# Patient Record
Sex: Male | Born: 1965 | Race: White | Hispanic: No | Marital: Married | Smoking: Current every day smoker
Health system: Southern US, Community
[De-identification: ages and names within clinical notes are randomized; demographics above are authoritative.]

## PROBLEM LIST (undated history)

## (undated) DIAGNOSIS — I2510 Atherosclerotic heart disease of native coronary artery without angina pectoris: Secondary | ICD-10-CM

## (undated) DIAGNOSIS — Z720 Tobacco use: Secondary | ICD-10-CM

## (undated) DIAGNOSIS — F411 Generalized anxiety disorder: Secondary | ICD-10-CM

## (undated) DIAGNOSIS — F429 Obsessive-compulsive disorder, unspecified: Secondary | ICD-10-CM

## (undated) HISTORY — DX: Obsessive-compulsive disorder, unspecified: F42.9

## (undated) HISTORY — PX: ANKLE RECONSTRUCTION: SHX1151

## (undated) HISTORY — DX: Atherosclerotic heart disease of native coronary artery without angina pectoris: I25.10

## (undated) HISTORY — PX: APPENDECTOMY: SHX54

---

## 2013-06-01 ENCOUNTER — Emergency Department (HOSPITAL_BASED_OUTPATIENT_CLINIC_OR_DEPARTMENT_OTHER): Payer: 59

## 2013-06-01 ENCOUNTER — Encounter (HOSPITAL_BASED_OUTPATIENT_CLINIC_OR_DEPARTMENT_OTHER): Payer: Self-pay | Admitting: *Deleted

## 2013-06-01 ENCOUNTER — Inpatient Hospital Stay (HOSPITAL_BASED_OUTPATIENT_CLINIC_OR_DEPARTMENT_OTHER)
Admission: EM | Admit: 2013-06-01 | Discharge: 2013-06-04 | DRG: 690 | Disposition: A | Discharge status: Home / Self Care | Payer: 59 | Attending: Internal Medicine | Admitting: Internal Medicine

## 2013-06-01 DIAGNOSIS — B9789 Other viral agents as the cause of diseases classified elsewhere: Secondary | ICD-10-CM | POA: Diagnosis present

## 2013-06-01 DIAGNOSIS — N200 Calculus of kidney: Secondary | ICD-10-CM | POA: Diagnosis present

## 2013-06-01 DIAGNOSIS — Z9089 Acquired absence of other organs: Secondary | ICD-10-CM

## 2013-06-01 DIAGNOSIS — E860 Dehydration: Secondary | ICD-10-CM | POA: Diagnosis present

## 2013-06-01 DIAGNOSIS — K7689 Other specified diseases of liver: Secondary | ICD-10-CM | POA: Diagnosis present

## 2013-06-01 DIAGNOSIS — F1210 Cannabis abuse, uncomplicated: Secondary | ICD-10-CM | POA: Diagnosis present

## 2013-06-01 DIAGNOSIS — N390 Urinary tract infection, site not specified: Secondary | ICD-10-CM | POA: Diagnosis present

## 2013-06-01 DIAGNOSIS — D696 Thrombocytopenia, unspecified: Secondary | ICD-10-CM | POA: Diagnosis present

## 2013-06-01 DIAGNOSIS — N509 Disorder of male genital organs, unspecified: Secondary | ICD-10-CM | POA: Diagnosis present

## 2013-06-01 DIAGNOSIS — F17200 Nicotine dependence, unspecified, uncomplicated: Secondary | ICD-10-CM | POA: Diagnosis present

## 2013-06-01 DIAGNOSIS — N179 Acute kidney failure, unspecified: Secondary | ICD-10-CM | POA: Diagnosis present

## 2013-06-01 DIAGNOSIS — R6510 Systemic inflammatory response syndrome (SIRS) of non-infectious origin without acute organ dysfunction: Secondary | ICD-10-CM | POA: Diagnosis present

## 2013-06-01 DIAGNOSIS — R7402 Elevation of levels of lactic acid dehydrogenase (LDH): Secondary | ICD-10-CM | POA: Diagnosis present

## 2013-06-01 DIAGNOSIS — R109 Unspecified abdominal pain: Secondary | ICD-10-CM | POA: Diagnosis present

## 2013-06-01 DIAGNOSIS — R7401 Elevation of levels of liver transaminase levels: Secondary | ICD-10-CM | POA: Diagnosis present

## 2013-06-01 LAB — COMPREHENSIVE METABOLIC PANEL
ALT: 100 U/L — ABNORMAL HIGH (ref 0–53)
AST: 88 U/L — ABNORMAL HIGH (ref 0–37)
Albumin: 4.3 g/dL (ref 3.5–5.2)
CO2: 28 mEq/L (ref 19–32)
Calcium: 9.9 mg/dL (ref 8.4–10.5)
GFR calc non Af Amer: 49 mL/min — ABNORMAL LOW (ref >90)
Sodium: 136 mEq/L (ref 135–145)

## 2013-06-01 LAB — CBC WITH DIFFERENTIAL/PLATELET
Basophils Absolute: 0 10*3/uL (ref 0.0–0.1)
Basophils Relative: 0 % (ref 0–1)
Eosinophils Relative: 0 % (ref 0–5)
Lymphocytes Relative: 8 % — ABNORMAL LOW (ref 12–46)
MCV: 91.1 fL (ref 78.0–100.0)
Neutro Abs: 17.8 10*3/uL — ABNORMAL HIGH (ref 1.7–7.7)
Platelets: 235 10*3/uL (ref 150–400)
RDW: 13.1 % (ref 11.5–15.5)
WBC: 20.5 10*3/uL — ABNORMAL HIGH (ref 4.0–10.5)

## 2013-06-01 LAB — URINE MICROSCOPIC-ADD ON

## 2013-06-01 LAB — URINALYSIS, ROUTINE W REFLEX MICROSCOPIC
Nitrite: NEGATIVE
Specific Gravity, Urine: 1.034 — ABNORMAL HIGH (ref 1.005–1.030)
Urobilinogen, UA: 0.2 mg/dL (ref 0.0–1.0)

## 2013-06-01 MED ORDER — ONDANSETRON HCL 4 MG/2ML IJ SOLN
4.0000 mg | Freq: Once | INTRAMUSCULAR | Status: AC
  Administered 2013-06-01: 4 mg via INTRAVENOUS
  Filled 2013-06-01: qty 2

## 2013-06-01 MED ORDER — HYDROMORPHONE HCL PF 1 MG/ML IJ SOLN
1.0000 mg | Freq: Once | INTRAMUSCULAR | Status: AC
  Administered 2013-06-01: 1 mg via INTRAVENOUS
  Filled 2013-06-01: qty 1

## 2013-06-01 MED ORDER — SODIUM CHLORIDE 0.9 % IV BOLUS (SEPSIS)
1000.0000 mL | Freq: Once | INTRAVENOUS | Status: AC
  Administered 2013-06-01: 1000 mL via INTRAVENOUS

## 2013-06-01 NOTE — ED Provider Notes (Signed)
CSN: [id number]     Arrival date & time 06/01/13  1906 History   This chart was scribed for Rolan Bucco, MD by Karle Plumber, ED Scribe. This patient was seen in room MH04/MH04 and the patient's care was started at 8:45 PM.    Chief Complaint  Patient presents with  . Flank Pain   The history is provided by the patient. No language interpreter was used.   HPI Comments:  Edward Serrano is a 47 y.o. male who presents to the Emergency Department complaining of constant, severe right-sided flank pain onset 1:30 AM today. Wife states that pain started in his right groin and moved up to RLQ and flank. He has had several episodes of associated emesis. He was seen at [facility] this morning for the same symptoms and had a CT scan. He reports he was told it is suspected that he passed a kidney stone, but one was not detected on scan. He was prescribed Valium, Phenergan, Percocet, and Toradol but has not had any relief. His last doses of any medicines were at 4:30 PM today. He denies fever and dysuria. Pt denies h/o kidney stones. He currently smokes everyday and is an occasional alcohol user.  History reviewed. No pertinent past medical history. Past Surgical History  Procedure Laterality Date  . Appendectomy    . Ankle reconstruction     No family history on file. History  Substance Use Topics  . Smoking status: Current Every Day Smoker    Types: Cigarettes  . Smokeless tobacco: Never Used  . Alcohol Use: Yes     Comment: occasional    Review of Systems  Constitutional: Positive for fatigue. Negative for fever, chills and diaphoresis.  HENT: Negative for congestion, rhinorrhea and sneezing.   Eyes: Negative.   Respiratory: Negative for cough, chest tightness and shortness of breath.   Cardiovascular: Negative for chest pain and leg swelling.  Gastrointestinal: Positive for nausea, vomiting and abdominal pain. Negative for diarrhea and blood in stool.  Genitourinary: Positive  for flank pain. Negative for frequency, hematuria, difficulty urinating and testicular pain.  Musculoskeletal: Positive for back pain. Negative for arthralgias.  Skin: Negative for rash.  Neurological: Negative for dizziness, speech difficulty, weakness, numbness and headaches.    Allergies  Review of patient's allergies indicates no known allergies.  Home Medications   Current Outpatient Rx  Name  Route  Sig  Dispense  Refill  . diazepam (VALIUM) 5 MG tablet   Oral   Take 10 mg by mouth every 8 (eight) hours as needed for anxiety.         Marland Kitchen ketorolac (TORADOL) 10 MG tablet   Oral   Take 10 mg by mouth every 6 (six) hours as needed for pain.         Marland Kitchen oxyCODONE-acetaminophen (PERCOCET/ROXICET) 5-325 MG per tablet   Oral   Take 1 tablet by mouth every 4 (four) hours as needed for pain.         . promethazine (PHENERGAN) 25 MG tablet   Oral   Take 25 mg by mouth every 6 (six) hours as needed for nausea.          Triage Vitals: Ht 6' (1.829 m)  Wt 225 lb (102.059 kg)  BMI 30.51 kg/m2 Physical Exam  Constitutional: He is oriented to person, place, and time. He appears well-developed and well-nourished.  HENT:  Head: Normocephalic and atraumatic.  Eyes: Pupils are equal, round, and reactive to light.  Neck: Normal  range of motion. Neck supple.  Cardiovascular: Normal rate, regular rhythm and normal heart sounds.   Pulmonary/Chest: Effort normal and breath sounds normal. No respiratory distress. He has no wheezes. He has no rales. He exhibits no tenderness.  Abdominal: Soft. Bowel sounds are normal. There is tenderness (mild TTP right flank, right lower abd.). There is no rebound and no guarding.  Genitourinary:  Mild tenderness to the right scrotum, but no significant testicular tenderness/no hernias noted  Musculoskeletal: Normal range of motion. He exhibits no edema.  Lymphadenopathy:    He has no cervical adenopathy.  Neurological: He is alert and oriented to  person, place, and time.  Skin: Skin is warm and dry. No rash noted.  Psychiatric: He has a normal mood and affect.    ED Course  Procedures (including critical care time) DIAGNOSTIC STUDIES:  COORDINATION OF CARE: 8:52 PM- Will order chest x-ray and Zofran, Dilaudid and IV fluids. Will review CT results from earlier visit to [facility]. Patient and family verbalize understanding and agree.  Labs Review Results for orders placed during the hospital encounter of 06/01/13  URINALYSIS, ROUTINE W REFLEX MICROSCOPIC      Result Value Range   Color, Urine AMBER (*) YELLOW   APPearance CLOUDY (*) CLEAR   Specific Gravity, Urine 1.034 (*) 1.005 - 1.030   pH 6.0  5.0 - 8.0   Glucose, UA NEGATIVE  NEGATIVE mg/dL   Hgb urine dipstick NEGATIVE  NEGATIVE   Bilirubin Urine SMALL (*) NEGATIVE   Ketones, ur 15 (*) NEGATIVE mg/dL   Protein, ur 161 (*) NEGATIVE mg/dL   Urobilinogen, UA 0.2  0.0 - 1.0 mg/dL   Nitrite NEGATIVE  NEGATIVE   Leukocytes, UA NEGATIVE  NEGATIVE  URINE MICROSCOPIC-ADD ON      Result Value Range   Squamous Epithelial / LPF FEW (*) RARE   WBC, UA 3-6  <3 WBC/hpf   RBC / HPF 0-2  <3 RBC/hpf   Bacteria, UA MANY (*) RARE   Casts HYALINE CASTS (*) NEGATIVE   Urine-Other MUCOUS PRESENT    CBC WITH DIFFERENTIAL      Result Value Range   WBC 20.5 (*) 4.0 - 10.5 K/uL   RBC 5.70  4.22 - 5.81 MIL/uL   Hemoglobin 18.3 (*) 13.0 - 17.0 g/dL   HCT 09.6  04.5 - 40.9 %   MCV 91.1  78.0 - 100.0 fL   MCH 32.1  26.0 - 34.0 pg   MCHC 35.3  30.0 - 36.0 g/dL   RDW 81.1  91.4 - 78.2 %   Platelets 235  150 - 400 K/uL   Neutrophils Relative % 87 (*) 43 - 77 %   Neutro Abs 17.8 (*) 1.7 - 7.7 K/uL   Lymphocytes Relative 8 (*) 12 - 46 %   Lymphs Abs 1.7  0.7 - 4.0 K/uL   Monocytes Relative 5  3 - 12 %   Monocytes Absolute 1.0  0.1 - 1.0 K/uL   Eosinophils Relative 0  0 - 5 %   Eosinophils Absolute 0.0  0.0 - 0.7 K/uL   Basophils Relative 0  0 - 1 %   Basophils Absolute 0.0  0.0 - 0.1 K/uL   COMPREHENSIVE METABOLIC PANEL      Result Value Range   Sodium 136  135 - 145 mEq/L   Potassium 4.2  3.5 - 5.1 mEq/L   Chloride 99  96 - 112 mEq/L   CO2 28  19 - 32 mEq/L  Glucose, Bld 129 (*) 70 - 99 mg/dL   BUN 11  6 - 23 mg/dL   Creatinine, Ser 4.09 (*) 0.50 - 1.35 mg/dL   Calcium 9.9  8.4 - 81.1 mg/dL   Total Protein 7.3  6.0 - 8.3 g/dL   Albumin 4.3  3.5 - 5.2 g/dL   AST 88 (*) 0 - 37 U/L   ALT 100 (*) 0 - 53 U/L   Alkaline Phosphatase 78  39 - 117 U/L   Total Bilirubin 0.8  0.3 - 1.2 mg/dL   GFR calc non Af Amer 49 (*) >90 mL/min   GFR calc Af Amer 57 (*) >90 mL/min  LIPASE, BLOOD      Result Value Range   Lipase 22  11 - 59 U/L   Dg Abd 1 View  06/01/2013   *RADIOLOGY REPORT*  Clinical Data:  ABDOMEN - 1 VIEW  Comparison: Abdominal pain flank pain nausea and vomiting  Findings: Nonobstructive bowel gas pattern with no abnormally dilated loops of bowel.  No abnormal opacities.  IMPRESSION: Negative   Original Report Authenticated By: Esperanza Heir, M.D.    Imaging Review Dg Abd 1 View  06/01/2013   *RADIOLOGY REPORT*  Clinical Data:  ABDOMEN - 1 VIEW  Comparison: Abdominal pain flank pain nausea and vomiting  Findings: Nonobstructive bowel gas pattern with no abnormally dilated loops of bowel.  No abnormal opacities.  IMPRESSION: Negative   Original Report Authenticated By: Esperanza Heir, M.D.    MDM   1. Abdominal  pain, other specified site    Pt presents with right-sided flank pain and multiple episodes of vomiting today. He's had no improvement with medications prescribed this morning. I did review the results from [facility] and at that point he had a white count of 16,000. CT scan showed a questionable filling defect or mass in the proximal descending colon. This was a noncontrast CT scan there is no evidence of hydroureter were kidney stones. I will go ahead and recheck labs and I do feel that given the patient's current condition we should do a  repeat CT scan with contrast. He still markedly uncomfortable and nauseated.  Dr Nicanor Alcon to take over care pending CT.  I personally performed the services described in this documentation, which was scribed in my presence.  The recorded information has been reviewed and considered.    Rolan Bucco, MD 06/01/13 [phone]

## 2013-06-01 NOTE — ED Notes (Addendum)
Pt seen at [facility] this am for right flank pain and vomiting- had Ct and was told he may have passed a kidney stone- Was sent home with meds- pt has been vomiting continuously today- pt returned to [facility] and waited 2-3 hours without being seen so came here

## 2013-06-01 NOTE — ED Notes (Addendum)
Pt had an episode of vomiting....established an IV.  Resting quietly on his side.

## 2013-06-02 ENCOUNTER — Encounter (HOSPITAL_COMMUNITY): Payer: Self-pay | Admitting: Internal Medicine

## 2013-06-02 ENCOUNTER — Observation Stay (HOSPITAL_COMMUNITY): Payer: 59

## 2013-06-02 DIAGNOSIS — R109 Unspecified abdominal pain: Secondary | ICD-10-CM | POA: Diagnosis present

## 2013-06-02 DIAGNOSIS — N390 Urinary tract infection, site not specified: Principal | ICD-10-CM

## 2013-06-02 LAB — URINE CULTURE: Culture: NO GROWTH

## 2013-06-02 LAB — CBC
HCT: 46.4 % (ref 39.0–52.0)
Hemoglobin: 17.1 g/dL — ABNORMAL HIGH (ref 13.0–17.0)
MCHC: 36.9 g/dL — ABNORMAL HIGH (ref 30.0–36.0)
MCV: 90.1 fL (ref 78.0–100.0)

## 2013-06-02 LAB — CREATININE, SERUM
GFR calc Af Amer: 60 mL/min — ABNORMAL LOW (ref >90)
GFR calc non Af Amer: 52 mL/min — ABNORMAL LOW (ref >90)

## 2013-06-02 MED ORDER — CEFTRIAXONE SODIUM 1 G IJ SOLR
1.0000 g | INTRAMUSCULAR | Status: DC
  Administered 2013-06-03 – 2013-06-04 (×2): 1 g via INTRAVENOUS
  Filled 2013-06-02 (×2): qty 10

## 2013-06-02 MED ORDER — ENOXAPARIN SODIUM 40 MG/0.4ML ~~LOC~~ SOLN
40.0000 mg | SUBCUTANEOUS | Status: DC
  Administered 2013-06-02: 40 mg via SUBCUTANEOUS
  Filled 2013-06-02 (×2): qty 0.4

## 2013-06-02 MED ORDER — BIOTENE DRY MOUTH MT LIQD: 15.0000 mL | Freq: Two times a day (BID) | OROMUCOSAL | Status: DC

## 2013-06-02 MED ORDER — ONDANSETRON HCL 4 MG PO TABS
4.0000 mg | ORAL_TABLET | Freq: Four times a day (QID) | ORAL | Status: DC | PRN
  Administered 2013-06-02: 4 mg via ORAL
  Filled 2013-06-02: qty 1

## 2013-06-02 MED ORDER — ONDANSETRON HCL 4 MG/2ML IJ SOLN
4.0000 mg | Freq: Once | INTRAMUSCULAR | Status: AC
  Administered 2013-06-02: 4 mg via INTRAVENOUS
  Filled 2013-06-02: qty 2

## 2013-06-02 MED ORDER — DOXYCYCLINE HYCLATE 100 MG IV SOLR
100.0000 mg | Freq: Two times a day (BID) | INTRAVENOUS | Status: DC
  Administered 2013-06-02 – 2013-06-04 (×5): 100 mg via INTRAVENOUS
  Filled 2013-06-02 (×6): qty 100

## 2013-06-02 MED ORDER — SODIUM CHLORIDE 0.9 % IJ SOLN
3.0000 mL | Freq: Two times a day (BID) | INTRAMUSCULAR | Status: DC
  Administered 2013-06-02 – 2013-06-04 (×4): 3 mL via INTRAVENOUS

## 2013-06-02 MED ORDER — ONDANSETRON HCL 4 MG/2ML IJ SOLN
4.0000 mg | Freq: Four times a day (QID) | INTRAMUSCULAR | Status: DC | PRN
  Administered 2013-06-02 – 2013-06-03 (×2): 4 mg via INTRAVENOUS
  Filled 2013-06-02 (×3): qty 2

## 2013-06-02 MED ORDER — HYDROMORPHONE HCL PF 1 MG/ML IJ SOLN
1.0000 mg | Freq: Once | INTRAMUSCULAR | Status: AC
  Administered 2013-06-02: 1 mg via INTRAVENOUS
  Filled 2013-06-02: qty 1

## 2013-06-02 MED ORDER — IOHEXOL 300 MG/ML  SOLN
50.0000 mL | Freq: Once | INTRAMUSCULAR | Status: AC | PRN
  Administered 2013-06-02: 50 mL via ORAL

## 2013-06-02 MED ORDER — SODIUM CHLORIDE 0.9 % IV SOLN
INTRAVENOUS | Status: AC
  Administered 2013-06-02 (×2): via INTRAVENOUS

## 2013-06-02 MED ORDER — PANTOPRAZOLE SODIUM 20 MG PO TBEC
20.0000 mg | DELAYED_RELEASE_TABLET | Freq: Every day | ORAL | Status: DC
  Administered 2013-06-02 – 2013-06-03 (×2): 20 mg via ORAL
  Filled 2013-06-02 (×3): qty 1

## 2013-06-02 MED ORDER — CHLORHEXIDINE GLUCONATE 0.12 % MT SOLN: 15.0000 mL | Freq: Two times a day (BID) | OROMUCOSAL | Status: DC

## 2013-06-02 MED ORDER — KETOROLAC TROMETHAMINE 30 MG/ML IJ SOLN
30.0000 mg | Freq: Once | INTRAMUSCULAR | Status: AC
  Administered 2013-06-02: 30 mg via INTRAVENOUS
  Filled 2013-06-02: qty 1

## 2013-06-02 MED ORDER — HYDROMORPHONE HCL PF 1 MG/ML IJ SOLN
2.0000 mg | INTRAMUSCULAR | Status: DC | PRN
  Administered 2013-06-02: 1 mg via INTRAVENOUS
  Administered 2013-06-02 – 2013-06-04 (×9): 2 mg via INTRAVENOUS
  Filled 2013-06-02 (×4): qty 2
  Filled 2013-06-02: qty 1
  Filled 2013-06-02 (×3): qty 2
  Filled 2013-06-02 (×2): qty 1
  Filled 2013-06-02: qty 2

## 2013-06-02 MED ORDER — DEXTROSE 5 % IV SOLN
1.0000 g | Freq: Once | INTRAVENOUS | Status: AC
  Administered 2013-06-02: 1 g via INTRAVENOUS
  Filled 2013-06-02: qty 10

## 2013-06-02 MED ORDER — MORPHINE SULFATE 2 MG/ML IJ SOLN
1.0000 mg | INTRAMUSCULAR | Status: DC | PRN
  Administered 2013-06-02 (×2): 1 mg via INTRAVENOUS
  Filled 2013-06-02 (×2): qty 1

## 2013-06-02 NOTE — Progress Notes (Signed)
Flow manager notified of patient's arrival.  She has paged Dr. Kem Kays to admit patient.

## 2013-06-02 NOTE — ED Notes (Signed)
Pt sitting up drinking po contrast.  Mouth swab offered for dry mouth following drinking mixture.

## 2013-06-02 NOTE — Progress Notes (Signed)
[facility] PROGRESS NOTE  Edward Serrano ZOX:[id number] DOB: 1966-05-02 DOA: 06/01/2013 PCP: Almedia Balls, MD  Assessment/Plan: SIRS with right testicular pain Patient presented with severe right testicular pain with marked leukocytosis and acute kidney injury. Workup including CT abdomen and pelvis, scrotal ultrasound and Doppler of the scrotum was unremarkable. UA does suggest UTI. Likely had a ureteric colic with  Passage of  renal stone. Continue IV hydration and pain control with when necessary Dilaudid. We'll hold NSAID given AKI. monitor WBC continue empiric rocephin and doxycycline. Check urine drug screen  UTI as above continue IV rocephin Check urine cx  Acute kidney injury Likely  in the setting of dehydration and UTI. Monitor with IV fluids  DVT Prophylaxis Subcutaneous Lovenox  Code Status: FULL CODE Family Communication: wife at bedside Disposition Plan: home once improved   Consultants:  NONE  Procedures:  *none  Antibiotics:  IV Rocephin and doxycycline  HPI/Subjective: Patient c/o rt flank pain   Objective: Filed Vitals:   06/02/13 1420  BP: 140/95  Pulse:   Temp: 98.3 F (36.8 C)  Resp: 18    Intake/Output Summary (Last 24 hours) at 06/02/13 1711 Last data filed at 06/02/13 1542  Gross per 24 hour  Intake   2267 ml  Output      0 ml  Net   2267 ml   Filed Weights   06/01/13 1921 06/02/13 0543  Weight: 102.059 kg (225 lb) 105.2 kg (231 lb 14.8 oz)    Exam:   General:  Mitigated really no acute distress  HEENT: No pallor, moist oral mucosa  Chest: Clear to auscultation bilaterally, no added sounds  CVS: Normal S1 and S2, no murmurs rub or gallop  Abdomen: Soft,  Nondistended,mild suprapubic tenderness, no scrotal enlargement, minimal tenderness over right testis, right CVA tenderness Extremities: Warm, no edema CNS: AAO x3   Data Reviewed: Basic Metabolic Panel:  Recent Labs Lab 06/01/13 2033 06/02/13 0750  NA  136  --   K 4.2  --   CL 99  --   CO2 28  --   GLUCOSE 129*  --   BUN 11  --   CREATININE 1.60* 1.54*  CALCIUM 9.9  --    Liver Function Tests:  Recent Labs Lab 06/01/13 2033  AST 88*  ALT 100*  ALKPHOS 78  BILITOT 0.8  PROT 7.3  ALBUMIN 4.3    Recent Labs Lab 06/01/13 2033  LIPASE 22   No results found for this basename: AMMONIA,  in the last 168 hours CBC:  Recent Labs Lab 06/01/13 2033 06/02/13 0750  WBC 20.5* 16.2*  NEUTROABS 17.8*  --   HGB 18.3* 17.1*  HCT 51.9 46.4  MCV 91.1 90.1  PLT 235 146*   Cardiac Enzymes: No results found for this basename: CKTOTAL, CKMB, CKMBINDEX, TROPONINI,  in the last 168 hours BNP (last 3 results) No results found for this basename: PROBNP,  in the last 8760 hours CBG: No results found for this basename: GLUCAP,  in the last 168 hours  Recent Results (from the past 240 hour(s))  URINE CULTURE     Status: None   Collection Time    06/01/13  8:21 PM      Result Value Range Status   Specimen Description URINE, CLEAN CATCH   Final   Special Requests NONE   Final   Culture  Setup Time     Final   Value: 06/01/2013 23:53     Performed at [facility]  Colony Count     Final   Value: NO GROWTH     Performed at [facility]   Culture     Final   Value: NO GROWTH     Performed at [facility]   Report Status 06/02/2013 FINAL   Final     Studies: Ct Abdomen Pelvis Wo Contrast  06/02/2013   *RADIOLOGY REPORT*  Clinical Data: Abdominal pain with vomiting.  CT ABDOMEN AND PELVIS WITHOUT CONTRAST  Technique:  Multidetector CT imaging of the abdomen and pelvis was performed following the standard protocol without intravenous contrast.  Comparison: CT from 1 day prior.  Findings:  LOWER CHEST:  Mediastinum: Unremarkable.  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  The mass measures 2.3 cm  in length. Followup recommendations given previously.  ABDOMEN/PELVIS:  Liver: Patchy low attenuation, better seen previously, consistent with steatosis.  Biliary: No evidence of biliary obstruction or stone.  Pancreas: Unremarkable.  Spleen: Unremarkable.  Adrenals: Unremarkable.  Kidneys and ureters: No hydronephrosis or stone.  Bladder: Unremarkable.  Bowel: No obstruction. Previously seen luminal irregularity in the ascending colon no longer evident.  No pericecal inflammatory changes.  Retroperitoneum: No mass or adenopathy.  Peritoneum: No free fluid or gas.  Reproductive: Unremarkable.  Vascular: Mild aortic and iliac atherosclerosis.  OSSEOUS: Post traumatic deformity to the right iliac wing. No suspicious lytic or blastic lesions.  IMPRESSION:  1.  No acute intra-abdominal findings. 2.  An ascending colon abnormality described on the previous report is no longer seen. The prior appearance was likely related to stool or mucosal redundancy.   Original Report Authenticated By: Tiburcio Pea   Dg Abd 1 View  06/01/2013   *RADIOLOGY REPORT*  Clinical Data:  ABDOMEN - 1 VIEW  Comparison: Abdominal pain flank pain nausea and vomiting  Findings: Nonobstructive bowel gas pattern with no abnormally dilated loops of bowel.  No abnormal opacities.  IMPRESSION: Negative   Original Report Authenticated By: Esperanza Heir, M.D.   US Scrotum  06/02/2013   *RADIOLOGY REPORT*  Clinical Data:  Pain and swelling  SCROTAL ULTRASOUND DOPPLER ULTRASOUND OF THE TESTICLES  Technique: Complete ultrasound examination of the testicles, epididymis, and other scrotal structures was performed.  Color and spectral Doppler ultrasound were also utilized to evaluate blood flow to the testicles.  Comparison:  Findings:  Right testis:  4.9 x 2.7 x 2.8 cm.  Homogeneous echotexture.  Left testis:  5.0 x 2.6 x 2.5 cm.  Homogeneous echotexture.  Right epididymis:  Within normal limits with exception of a small 4 mm cyst.  Left epididymis:   Within normal limits with exception of a small 3 mm cyst.  Hydrocele:  Small right-sided hydrocele is noted.  Varicocele:  No varicocele is noted.  Pulsed Doppler interrogation of both testes demonstrates low resistance flow bilaterally.  IMPRESSION: No evidence of torsion.  Small epididymal cysts bilaterally.  Small right hydrocele.   Original Report Authenticated By: Alcide Clever, M.D.   [place] Art/ven Flow Abd Pelv Doppler  06/02/2013   *RADIOLOGY REPORT*  Clinical Data:  Pain and swelling  SCROTAL ULTRASOUND DOPPLER ULTRASOUND OF THE TESTICLES  Technique: Complete ultrasound examination of the testicles, epididymis, and other scrotal structures was performed.  Color and spectral Doppler ultrasound were also utilized to evaluate blood flow to the testicles.  Comparison:  Findings:  Right testis:  4.9 x 2.7 x 2.8  cm.  Homogeneous echotexture.  Left testis:  5.0 x 2.6 x 2.5 cm.  Homogeneous echotexture.  Right epididymis:  Within normal limits with exception of a small 4 mm cyst.  Left epididymis:  Within normal limits with exception of a small 3 mm cyst.  Hydrocele:  Small right-sided hydrocele is noted.  Varicocele:  No varicocele is noted.  Pulsed Doppler interrogation of both testes demonstrates low resistance flow bilaterally.  IMPRESSION: No evidence of torsion.  Small epididymal cysts bilaterally.  Small right hydrocele.   Original Report Authenticated By: Alcide Clever, M.D.    Scheduled Meds: . [START ON 06/03/2013] cefTRIAXone (ROCEPHIN)  IV  1 g Intravenous Q24H  . doxycycline (VIBRAMYCIN) 100/200 mg IVPB  100 mg Intravenous Q12H  . enoxaparin (LOVENOX) injection  40 mg Subcutaneous Q24H  . sodium chloride  3 mL Intravenous Q12H   Continuous Infusions: . sodium chloride 100 mL/hr at 06/02/13 0711       Time spent: 20 minutes    Goran Olden  [facility] Pager [phone]. If 7PM-7AM, please contact night-coverage at [email or web address], password [facility] 06/02/2013, 5:11 PM  LOS: 1 day

## 2013-06-02 NOTE — ED Notes (Signed)
[facility] here to transport pt to [facility] 

## 2013-06-02 NOTE — ED Notes (Signed)
Pt requesting something for pain/nausea prior to transport. MD aware.

## 2013-06-02 NOTE — ED Notes (Signed)
Report received 

## 2013-06-02 NOTE — ED Notes (Signed)
LAC drawn results 1.01 hand delivered to Dr.Palumbo.

## 2013-06-02 NOTE — ED Notes (Signed)
Report given to Nancy,RN on 6N.  

## 2013-06-02 NOTE — H&P (Signed)
[facility] ADMISSION H&P  Chief Complaint: scrotal/abdominal pain  HPI: 47 yr. Old WM w/ no significant pmhx presents with the above stated complaints. He states over the past day he has had right sided scrotal pain which radiated to his abdomen and R lower back.  Initially he states it felt like he "was kicked in the testicles".  He had episodes of associated emesis and as a result has had decreased PO intake over past 1-2 days.  He was seen at [facility] and a CT scan was performed. He was told they suspected a renal stone and he was prescribed pain medication in addition to antiemetics.  He states the pain did not improve and he went to [facility] to be seen. At [facility], he was noted to have a WBC of 16k in addition to a questionable filling defect in proximal descending colon.  There was no mention of hydronephrosis or renal stones.  A CT abd/pelvis w/o IV contrast was repeated due to a Cr of 1.6.  This repeat CT showed that the previously seen colonic abnormality likely was stool or mucosal redundancy.  There were no acute intraabdominal findings.  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 Repeat WBC was noted to be 20k.  His Cr is 1.6 without a previous comparison available.  Lactic acid was normal.  His UA was negative for LE or Nitrites, but was cloudy, +ketones, and many hyaline casts.  Less than 2 RBCs seen pHPF.   History reviewed. No pertinent past medical history.  Past Surgical History  Procedure Laterality Date  . Appendectomy    . Ankle reconstruction      History reviewed. No pertinent family history. Social History:  reports that he has been smoking Cigarettes.  He has been smoking about 1.00 pack per day. He has never used smokeless tobacco. He reports that  drinks alcohol. He reports that he uses illicit drugs (Marijuana).  Allergies: No Known Allergies  Medications Prior to Admission   Medication Sig Dispense Refill  . diazepam (VALIUM) 5 MG tablet Take 10 mg by mouth every 8 (eight) hours as needed for anxiety.      Marland Kitchen ketorolac (TORADOL) 10 MG tablet Take 10 mg by mouth every 6 (six) hours as needed for pain.      Marland Kitchen oxyCODONE-acetaminophen (PERCOCET/ROXICET) 5-325 MG per tablet Take 1 tablet by mouth every 4 (four) hours as needed for pain.      . promethazine (PHENERGAN) 25 MG tablet Take 25 mg by mouth every 6 (six) hours as needed for nausea.        Results for orders placed during the hospital encounter of 06/01/13 (from the past 48 hour(s))  URINALYSIS, ROUTINE W REFLEX MICROSCOPIC     Status: Abnormal   Collection Time    06/01/13  8:21 PM      Result Value Range   Color, Urine AMBER (*) YELLOW   Comment: BIOCHEMICALS MAY BE AFFECTED BY COLOR   APPearance CLOUDY (*) CLEAR   Specific Gravity, Urine 1.034 (*) 1.005 - 1.030   pH 6.0  5.0 - 8.0   Glucose, UA NEGATIVE  NEGATIVE mg/dL   Hgb urine dipstick NEGATIVE  NEGATIVE   Bilirubin Urine SMALL (*) NEGATIVE   Ketones, ur 15 (*) NEGATIVE mg/dL   Protein, ur 161 (*) NEGATIVE mg/dL   Urobilinogen, UA 0.2  0.0 - 1.0 mg/dL   Nitrite NEGATIVE  NEGATIVE   Leukocytes, UA NEGATIVE  NEGATIVE  URINE MICROSCOPIC-ADD ON     Status: Abnormal   Collection Time    06/01/13  8:21 PM      Result Value Range   Squamous Epithelial / LPF FEW (*) RARE   WBC, UA 3-6  <3 WBC/hpf   RBC / HPF 0-2  <3 RBC/hpf   Bacteria, UA MANY (*) RARE   Casts HYALINE CASTS (*) NEGATIVE   Comment: WBC CAST     GRANULAR CAST   Urine-Other MUCOUS PRESENT    CBC WITH DIFFERENTIAL     Status: Abnormal   Collection Time    06/01/13  8:33 PM      Result Value Range   WBC 20.5 (*) 4.0 - 10.5 K/uL   RBC 5.70  4.22 - 5.81 MIL/uL   Hemoglobin 18.3 (*) 13.0 - 17.0 g/dL   HCT 16.1  09.6 - 04.5 %   MCV 91.1  78.0 - 100.0 fL   MCH 32.1  26.0 - 34.0 pg   MCHC 35.3  30.0 - 36.0 g/dL   RDW 40.9  81.1 - 91.4 %   Platelets 235  150 - 400 K/uL    Neutrophils Relative % 87 (*) 43 - 77 %   Neutro Abs 17.8 (*) 1.7 - 7.7 K/uL   Lymphocytes Relative 8 (*) 12 - 46 %   Lymphs Abs 1.7  0.7 - 4.0 K/uL   Monocytes Relative 5  3 - 12 %   Monocytes Absolute 1.0  0.1 - 1.0 K/uL   Eosinophils Relative 0  0 - 5 %   Eosinophils Absolute 0.0  0.0 - 0.7 K/uL   Basophils Relative 0  0 - 1 %   Basophils Absolute 0.0  0.0 - 0.1 K/uL  COMPREHENSIVE METABOLIC PANEL     Status: Abnormal   Collection Time    06/01/13  8:33 PM      Result Value Range   Sodium 136  135 - 145 mEq/L   Potassium 4.2  3.5 - 5.1 mEq/L   Chloride 99  96 - 112 mEq/L   CO2 28  19 - 32 mEq/L   Glucose, Bld 129 (*) 70 - 99 mg/dL   BUN 11  6 - 23 mg/dL   Creatinine, Ser 7.82 (*) 0.50 - 1.35 mg/dL   Calcium 9.9  8.4 - 95.6 mg/dL   Total Protein 7.3  6.0 - 8.3 g/dL   Albumin 4.3  3.5 - 5.2 g/dL   AST 88 (*) 0 - 37 U/L   ALT 100 (*) 0 - 53 U/L   Alkaline Phosphatase 78  39 - 117 U/L   Total Bilirubin 0.8  0.3 - 1.2 mg/dL   GFR calc non Af Amer 49 (*) >90 mL/min   GFR calc Af Amer 57 (*) >90 mL/min   Comment: (NOTE)     The eGFR has been calculated using the CKD EPI equation.     This calculation has not been validated in all clinical situations.     eGFR's persistently <90 mL/min signify possible Chronic Kidney     Disease.  LIPASE, BLOOD     Status: None   Collection Time    06/01/13  8:33 PM      Result Value Range   Lipase 22  11 - 59 U/L  CG4 I-STAT (LACTIC ACID)     Status: None   Collection Time    06/02/13 12:01 AM  Result Value Range   Lactic Acid, Venous 1.01  0.5 - 2.2 mmol/L   Ct Abdomen Pelvis Wo Contrast  06/02/2013   *RADIOLOGY REPORT*  Clinical Data: Abdominal pain with vomiting.  CT ABDOMEN AND PELVIS WITHOUT CONTRAST  Technique:  Multidetector CT imaging of the abdomen and pelvis was performed following the standard protocol without intravenous contrast.  Comparison: CT from 1 day prior.  Findings:  LOWER CHEST:  Mediastinum: Unremarkable.   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  The mass measures 2.3 cm in length. Followup recommendations given previously.  ABDOMEN/PELVIS:  Liver: Patchy low attenuation, better seen previously, consistent with steatosis.  Biliary: No evidence of biliary obstruction or stone.  Pancreas: Unremarkable.  Spleen: Unremarkable.  Adrenals: Unremarkable.  Kidneys and ureters: No hydronephrosis or stone.  Bladder: Unremarkable.  Bowel: No obstruction. Previously seen luminal irregularity in the ascending colon no longer evident.  No pericecal inflammatory changes.  Retroperitoneum: No mass or adenopathy.  Peritoneum: No free fluid or gas.  Reproductive: Unremarkable.  Vascular: Mild aortic and iliac atherosclerosis.  OSSEOUS: Post traumatic deformity to the right iliac wing. No suspicious lytic or blastic lesions.  IMPRESSION:  1.  No acute intra-abdominal findings. 2.  An ascending colon abnormality described on the previous report is no longer seen. The prior appearance was likely related to stool or mucosal redundancy.   Original Report Authenticated By: Tiburcio Pea   Dg Abd 1 View  06/01/2013   *RADIOLOGY REPORT*  Clinical Data:  ABDOMEN - 1 VIEW  Comparison: Abdominal pain flank pain nausea and vomiting  Findings: Nonobstructive bowel gas pattern with no abnormally dilated loops of bowel.  No abnormal opacities.  IMPRESSION: Negative   Original Report Authenticated By: Esperanza Heir, M.D.    Review of Systems  Constitutional: Positive for fever, chills and malaise/fatigue.  Eyes: Negative for blurred vision and photophobia.  Respiratory: Negative for cough, hemoptysis, sputum production, shortness of breath and wheezing.   Cardiovascular: Negative for chest pain, palpitations, orthopnea and leg swelling.  Gastrointestinal: Positive for nausea, vomiting and abdominal pain. Negative for  diarrhea, blood in stool and melena.  Genitourinary: Positive for flank pain. Negative for dysuria and urgency.  Musculoskeletal: Positive for back pain.  Neurological: Negative for dizziness, loss of consciousness, weakness and headaches.  Psychiatric/Behavioral: Negative for depression.    Blood pressure 124/72, pulse 81, temperature 98.5 F (36.9 C), temperature source Oral, resp. rate 18, height 6' (1.829 m), weight 231 lb 14.8 oz (105.2 kg), SpO2 98.00%. Physical Exam  Constitutional: He is oriented to person, place, and time. He appears well-developed and well-nourished. No distress.  HENT:  Head: Normocephalic and atraumatic.  Eyes: Conjunctivae and EOM are normal. Pupils are equal, round, and reactive to light.  Neck: Normal range of motion. Neck supple. No tracheal deviation present. No thyromegaly present.  Cardiovascular: Normal rate, regular rhythm, normal heart sounds and intact distal pulses.  Exam reveals no gallop and no friction rub.   No murmur heard. Respiratory: Effort normal and breath sounds normal. No respiratory distress. He has no wheezes. He has no rales.  GI: Soft. Bowel sounds are normal. He exhibits no distension and no mass. There is tenderness. There is no rebound and no guarding. Hernia confirmed negative in the right inguinal area and confirmed negative in the left inguinal area.  Genitourinary:    Right testis shows swelling and tenderness.  Musculoskeletal: He exhibits no edema.  Lymphadenopathy:    He has no cervical adenopathy.       Right: No inguinal adenopathy present.       Left: No inguinal adenopathy present.  Neurological: He is alert and oriented to person, place, and time. No cranial nerve deficit.  Skin: Skin is warm. He is not diaphoretic.  Psychiatric: He has a normal mood and affect.     Assessment/Plan  47 yr. Old WM w/ no significant pmhx presents with flank pain/scrotal pain and a leukocytosis. 1) Scrotal pain/flank pain: At this  time, his complaints may indicate underlying infection vs ureteral colic.  His right testicle is tender.  I will order a testicular U/S at this time. A urine GC will be ordered. Given his leukocytosis, I will start Rocephin and Doxycycline for now and send UC and BC.  No evidence of renal stone at this time. 2) AKI vs CKD: Start IVF. No hx of renal disease per patient. Repeat BMP in AM.  May have component of NSAID nephropathy if he has been taking these for pain. 3) Nerve sheath tumor?: He has no focal sensory or motor findings. He will need to follow this up as outpatient with MRI. I have informed him of importance of this for further clarification. Will need outpatient follow up. 4) Proph: lovenox. 5) FEN: IVF with NS and clear liquids to see if he tolerates. 6) Code: FULL  Jonah Blue, DO, FACP 06/02/2013, 6:39 AM

## 2013-06-02 NOTE — ED Notes (Signed)
Report given to [facility]. 

## 2013-06-03 DIAGNOSIS — N179 Acute kidney failure, unspecified: Secondary | ICD-10-CM | POA: Diagnosis present

## 2013-06-03 DIAGNOSIS — N390 Urinary tract infection, site not specified: Secondary | ICD-10-CM | POA: Diagnosis present

## 2013-06-03 DIAGNOSIS — R7401 Elevation of levels of liver transaminase levels: Secondary | ICD-10-CM | POA: Diagnosis present

## 2013-06-03 DIAGNOSIS — R6510 Systemic inflammatory response syndrome (SIRS) of non-infectious origin without acute organ dysfunction: Secondary | ICD-10-CM | POA: Diagnosis present

## 2013-06-03 LAB — COMPREHENSIVE METABOLIC PANEL
Albumin: 3.2 g/dL — ABNORMAL LOW (ref 3.5–5.2)
BUN: 13 mg/dL (ref 6–23)
Calcium: 8.7 mg/dL (ref 8.4–10.5)
Creatinine, Ser: 1.47 mg/dL — ABNORMAL HIGH (ref 0.50–1.35)
Total Bilirubin: 1.2 mg/dL (ref 0.3–1.2)
Total Protein: 6.3 g/dL (ref 6.0–8.3)

## 2013-06-03 LAB — CBC
HCT: 43 % (ref 39.0–52.0)
Hemoglobin: 15.7 g/dL (ref 13.0–17.0)
MCV: 90.5 fL (ref 78.0–100.0)
Platelets: 112 10*3/uL — ABNORMAL LOW (ref 150–400)
RBC: 4.75 MIL/uL (ref 4.22–5.81)
WBC: 14.9 10*3/uL — ABNORMAL HIGH (ref 4.0–10.5)

## 2013-06-03 LAB — URINE CULTURE
Colony Count: NO GROWTH
Culture: NO GROWTH

## 2013-06-03 LAB — HEPATITIS B SURFACE ANTIGEN: Hepatitis B Surface Ag: NEGATIVE

## 2013-06-03 LAB — HEPATITIS C ANTIBODY: HCV Ab: NEGATIVE

## 2013-06-03 NOTE — Progress Notes (Addendum)
[facility] PROGRESS NOTE  Edward Serrano UJW:[id number] DOB: 1966/02/27 DOA: 06/01/2013 PCP: Almedia Balls, MD  Brief narrative 47 y/o male admitted with severe right scrotal pain followed by pain over rt loin radiating to the groin. Patient had elevated wbc with significant leucocytosis on presentation. CT abd and pelvis, US scrotum and doppler of scrotum negative.   Assessment/Plan:  SIRS with right testicular pain  Patient presented with severe right testicular pain with marked leukocytosis and acute kidney injury. Workup including CT abdomen and pelvis, scrotal ultrasound and Doppler of the scrotum was unremarkable. UA does suggest UTI. Likely had a ureteric colic with Passage of renal stone.  Continue IV hydration and pain control with when necessary Dilaudid. We'll hold NSAID given AKI.  monitor WBC  continue empiric rocephin and doxycycline.  Check urine drug screen   transaminitis Fatty liver noted on CT abdomen. Check HCV and hbs ag.   ? nerve sheath tumor Mentioned on CT abd of posterior subpleural mass. Needs follow up as outpt   UTI  continue IV rocephin  pending urine cx   Acute kidney injury  Likely in the setting of dehydration and UTI. improving with IV fluids   DVT Prophylaxis  Subcutaneous Lovenox   Code Status: FULL CODE  Family Communication: wife at bedside  Disposition Plan: home once improved   Consultants:  NONE Procedures:  *none Antibiotics:  IV Rocephin and doxycycline   HPI/Subjective: No overnight issues. Reports some RLQ pain  Objective: Filed Vitals:   06/03/13 1405  BP: 129/84  Pulse: 91  Temp: 98.8 F (37.1 C)  Resp: 16    Intake/Output Summary (Last 24 hours) at 06/03/13 1548 Last data filed at 06/03/13 0556  Gross per 24 hour  Intake      0 ml  Output    400 ml  Net   -400 ml   Filed Weights   06/01/13 1921 06/02/13 0543  Weight: 102.059 kg (225 lb) 105.2 kg (231 lb 14.8 oz)    Exam: General: Mitigated really  no acute distress  HEENT: No pallor, moist oral mucosa  Chest: Clear to auscultation bilaterally, no added sounds  CVS: Normal S1 and S2, no murmurs rub or gallop  Abdomen: Soft, Nondistended,non tender, no scrotal enlargement or tenderness , mild  right CVA tenderness Extremities: Warm, no edema  CNS: AAO x3   Data Reviewed: Basic Metabolic Panel:  Recent Labs Lab 06/01/13 2033 06/02/13 0750 06/03/13 0513  NA 136  --  134*  K 4.2  --  4.1  CL 99  --  101  CO2 28  --  24  GLUCOSE 129*  --  98  BUN 11  --  13  CREATININE 1.60* 1.54* 1.47*  CALCIUM 9.9  --  8.7   Liver Function Tests:  Recent Labs Lab 06/01/13 2033 06/03/13 0513  AST 88* 67*  ALT 100* 148*  ALKPHOS 78 61  BILITOT 0.8 1.2  PROT 7.3 6.3  ALBUMIN 4.3 3.2*    Recent Labs Lab 06/01/13 2033  LIPASE 22   No results found for this basename: AMMONIA,  in the last 168 hours CBC:  Recent Labs Lab 06/01/13 2033 06/02/13 0750 06/03/13 0513  WBC 20.5* 16.2* 14.9*  NEUTROABS 17.8*  --   --   HGB 18.3* 17.1* 15.7  HCT 51.9 46.4 43.0  MCV 91.1 90.1 90.5  PLT 235 146* 112*   Cardiac Enzymes: No results found for this basename: CKTOTAL, CKMB, CKMBINDEX, TROPONINI,  in the last 168  hours BNP (last 3 results) No results found for this basename: PROBNP,  in the last 8760 hours CBG: No results found for this basename: GLUCAP,  in the last 168 hours  Recent Results (from the past 240 hour(s))  URINE CULTURE     Status: None   Collection Time    06/01/13  8:21 PM      Result Value Range Status   Specimen Description URINE, CLEAN CATCH   Final   Special Requests NONE   Final   Culture  Setup Time     Final   Value: 06/01/2013 23:53     Performed at [facility] Count     Final   Value: NO GROWTH     Performed at [facility]   Culture     Final   Value: NO GROWTH     Performed at [facility]   Report Status 06/02/2013 FINAL   Final  CULTURE, BLOOD (ROUTINE X 2)      Status: None   Collection Time    06/02/13  7:50 AM      Result Value Range Status   Specimen Description BLOOD LEFT ARM   Final   Special Requests BOTTLES DRAWN AEROBIC AND ANAEROBIC 10CC   Final   Culture  Setup Time     Final   Value: 06/02/2013 16:34     Performed at [facility]   Culture     Final   Value:        BLOOD CULTURE RECEIVED NO GROWTH TO DATE CULTURE WILL BE HELD FOR 5 DAYS BEFORE ISSUING A FINAL NEGATIVE REPORT     Performed at [facility]   Report Status PENDING   Incomplete  CULTURE, BLOOD (ROUTINE X 2)     Status: None   Collection Time    06/02/13  8:04 AM      Result Value Range Status   Specimen Description BLOOD LEFT HAND   Final   Special Requests BOTTLES DRAWN AEROBIC AND ANAEROBIC 10CC   Final   Culture  Setup Time     Final   Value: 06/02/2013 16:34     Performed at [facility]   Culture     Final   Value:        BLOOD CULTURE RECEIVED NO GROWTH TO DATE CULTURE WILL BE HELD FOR 5 DAYS BEFORE ISSUING A FINAL NEGATIVE REPORT     Performed at [facility]   Report Status PENDING   Incomplete     Studies: Ct Abdomen Pelvis Wo Contrast  06/02/2013   *RADIOLOGY REPORT*  Clinical Data: Abdominal pain with vomiting.  CT ABDOMEN AND PELVIS WITHOUT CONTRAST  Technique:  Multidetector CT imaging of the abdomen and pelvis was performed following the standard protocol without intravenous contrast.  Comparison: CT from 1 day prior.  Findings:  LOWER CHEST:  Mediastinum: Unremarkable.  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  The mass measures 2.3 cm in length. Followup recommendations given previously.  ABDOMEN/PELVIS:  Liver: Patchy low attenuation, better seen previously, consistent with steatosis.  Biliary: No evidence of biliary obstruction or stone.  Pancreas: Unremarkable.  Spleen: Unremarkable.  Adrenals:  Unremarkable.  Kidneys and ureters: No hydronephrosis or stone.  Bladder: Unremarkable.  Bowel: No obstruction. Previously seen luminal irregularity in the ascending colon no longer evident.  No pericecal inflammatory changes.  Retroperitoneum: No mass or adenopathy.  Peritoneum: No free fluid or gas.  Reproductive: Unremarkable.  Vascular: Mild aortic and iliac atherosclerosis.  OSSEOUS: Post traumatic deformity to the right iliac wing. No suspicious lytic or blastic lesions.  IMPRESSION:  1.  No acute intra-abdominal findings. 2.  An ascending colon abnormality described on the previous report is no longer seen. The prior appearance was likely related to stool or mucosal redundancy.   Original Report Authenticated By: Tiburcio Pea   Dg Abd 1 View  06/01/2013   *RADIOLOGY REPORT*  Clinical Data:  ABDOMEN - 1 VIEW  Comparison: Abdominal pain flank pain nausea and vomiting  Findings: Nonobstructive bowel gas pattern with no abnormally dilated loops of bowel.  No abnormal opacities.  IMPRESSION: Negative   Original Report Authenticated By: Esperanza Heir, M.D.   US Scrotum  06/02/2013   *RADIOLOGY REPORT*  Clinical Data:  Pain and swelling  SCROTAL ULTRASOUND DOPPLER ULTRASOUND OF THE TESTICLES  Technique: Complete ultrasound examination of the testicles, epididymis, and other scrotal structures was performed.  Color and spectral Doppler ultrasound were also utilized to evaluate blood flow to the testicles.  Comparison:  Findings:  Right testis:  4.9 x 2.7 x 2.8 cm.  Homogeneous echotexture.  Left testis:  5.0 x 2.6 x 2.5 cm.  Homogeneous echotexture.  Right epididymis:  Within normal limits with exception of a small 4 mm cyst.  Left epididymis:  Within normal limits with exception of a small 3 mm cyst.  Hydrocele:  Small right-sided hydrocele is noted.  Varicocele:  No varicocele is noted.  Pulsed Doppler interrogation of both testes demonstrates low resistance flow bilaterally.  IMPRESSION: No evidence of  torsion.  Small epididymal cysts bilaterally.  Small right hydrocele.   Original Report Authenticated By: Alcide Clever, M.D.   [place] Art/ven Flow Abd Pelv Doppler  06/02/2013   *RADIOLOGY REPORT*  Clinical Data:  Pain and swelling  SCROTAL ULTRASOUND DOPPLER ULTRASOUND OF THE TESTICLES  Technique: Complete ultrasound examination of the testicles, epididymis, and other scrotal structures was performed.  Color and spectral Doppler ultrasound were also utilized to evaluate blood flow to the testicles.  Comparison:  Findings:  Right testis:  4.9 x 2.7 x 2.8 cm.  Homogeneous echotexture.  Left testis:  5.0 x 2.6 x 2.5 cm.  Homogeneous echotexture.  Right epididymis:  Within normal limits with exception of a small 4 mm cyst.  Left epididymis:  Within normal limits with exception of a small 3 mm cyst.  Hydrocele:  Small right-sided hydrocele is noted.  Varicocele:  No varicocele is noted.  Pulsed Doppler interrogation of both testes demonstrates low resistance flow bilaterally.  IMPRESSION: No evidence of torsion.  Small epididymal cysts bilaterally.  Small right hydrocele.   Original Report Authenticated By: Alcide Clever, M.D.    Scheduled Meds: . cefTRIAXone (ROCEPHIN)  IV  1 g Intravenous Q24H  . doxycycline (VIBRAMYCIN) 100/200 mg IVPB  100 mg Intravenous Q12H  . pantoprazole  20 mg Oral QHS  . sodium chloride  3 mL Intravenous Q12H   Continuous Infusions:     Time spent: 25 minutes    Erling Arrazola  [facility] Pager [phone] If 7PM-7AM, please contact night-coverage at [email or web address], password [email or web address] 06/03/2013, 3:48 PM  LOS: 2 days

## 2013-06-04 LAB — BASIC METABOLIC PANEL
Chloride: 98 mEq/L (ref 96–112)
Creatinine, Ser: 1.3 mg/dL (ref 0.50–1.35)
GFR calc Af Amer: 74 mL/min — ABNORMAL LOW (ref >90)
Potassium: 3.9 mEq/L (ref 3.5–5.1)
Sodium: 132 mEq/L — ABNORMAL LOW (ref 135–145)

## 2013-06-04 LAB — CBC
Platelets: 98 10*3/uL — ABNORMAL LOW (ref 150–400)
RDW: 12.7 % (ref 11.5–15.5)
WBC: 12.7 10*3/uL — ABNORMAL HIGH (ref 4.0–10.5)

## 2013-06-04 LAB — GC/CHLAMYDIA PROBE AMP
CT Probe RNA: NEGATIVE
GC Probe RNA: NEGATIVE

## 2013-06-04 MED ORDER — OXYCODONE-ACETAMINOPHEN 5-325 MG PO TABS: 2.0000 | ORAL_TABLET | Freq: Four times a day (QID) | ORAL | Status: DC | PRN

## 2013-06-04 MED ORDER — CIPROFLOXACIN HCL 500 MG PO TABS: 500.0000 mg | ORAL_TABLET | Freq: Two times a day (BID) | ORAL | Status: DC

## 2013-06-04 NOTE — Progress Notes (Signed)
Discharge Note. Education reviewed with pt and pt's wife. Rx's given and explained. Pt and wife asked appropriate questions. Pt ready for discharge.

## 2013-06-04 NOTE — Discharge Summary (Signed)
Physician Discharge Summary  Edward Serrano ZOX:[id number] DOB: 1965-12-29 DOA: 06/01/2013  PCP: Almedia Balls, MD  Admit date: 06/01/2013 Discharge date: 06/04/2013  Time spent: 40 minutes  Recommendations for Outpatient Follow-up:  1. Home with outpt PCP follow up in 1 week. Needs repeat CBC to check wbc and platelets.  2. 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 The mass measures 2.3 cm in  length. Recommend outpatient  follow up with a repeat CT scan of the chest  in 6-12 months as outpatient.  Discharge Diagnoses:  Principal Problem:   SIRS (systemic inflammatory response syndrome)  Active Problems:   UTI (urinary tract infection)   Abdominal  pain, other specified site   Transaminitis   Acute kidney injury ?nerve sheath tumor   Discharge Condition: fair  Diet recommendation: regular  Filed Weights   06/01/13 1921 06/02/13 0543  Weight: 102.059 kg (225 lb) 105.2 kg (231 lb 14.8 oz)    History of present illness:  47 y/o male admitted with severe right scrotal pain followed by pain over rt loin radiating to the groin. Patient had elevated wbc with significant leucocytosis on presentation. CT abd and pelvis, US scrotum and doppler of scrotum US were all negative. UA suggested UTI.   Hospital Course:  SIRS with right testicular pain  Patient presented with severe right testicular pain with marked leukocytosis and acute kidney injury. Workup including CT abdomen and pelvis, scrotal ultrasound and Doppler of the scrotum was unremarkable. UA does suggest UTI. Likely had a ureteric colic with Passage of renal stone vs viral enteritis is the likely etiology of these symptoms. Urine cx and blood cx are negative. wbc has been trending down and AKI has now resolved. His testicular pain has resolved and now the pain is mainly located over his right flank and RLQ. Remains  afebrile.  Urin drug screen is pending and can be followed up as outpatient. -I will discharge him on some pain medications and oral ciprofloxacin to complete a 7 day course of antibiotics for his UTI.   transaminitis  Fatty liver noted on CT abdomen. Check HCV and hbs ag negative. Counseled on etoh cessation.   ? nerve sheath tumor  Mentioned on CT abd of posterior subpleural mass. Needs follow up as outpt with repeat CT of the chest in 6-12 months.  UTI  Urine cx negative. Will discharge on oral ciprofloxacin  Acute kidney injury  Likely in the setting of dehydration and UTI. improved with IV fluids.    Thrombocytopenia Mild. Possibly in the setting of acute illness/ ? viral infection .sq lovenox dced. Needs follow up as outpt.   Code Status: FULL CODE  Family Communication: wife at bedside  Disposition Plan: home with outpt PCP follow up  Consultants:  NONE Procedures:  *none Antibiotics:  IV Rocephin and doxycycline    Discharge Exam: Filed Vitals:   06/04/13 0532  BP: 141/86  Pulse: 98  Temp: 98.6 F (37 C)  Resp: 18    General: Mitigated really no acute distress  HEENT: No pallor, moist oral mucosa  Chest: Clear to auscultation bilaterally, no added sounds  CVS: Normal S1 and S2, no murmurs rub or gallop  Abdomen: Soft, Nondistended,non tender, no scrotal enlargement or tenderness , mild right CVA tenderness  Extremities: Warm, no edema  CNS: AAO x3   Discharge Instructions     Medication List  STOP taking these medications       ketorolac 10 MG tablet  Commonly known as:  TORADOL     promethazine 25 MG tablet  Commonly known as:  PHENERGAN      TAKE these medications       ciprofloxacin 500 MG tablet  Commonly known as:  CIPRO  Take 1 tablet (500 mg total) by mouth 2 (two) times daily.     diazepam 5 MG tablet  Commonly known as:  VALIUM  Take 10 mg by mouth every 8 (eight) hours as needed for anxiety.     oxyCODONE-acetaminophen  5-325 MG per tablet  Commonly known as:  PERCOCET/ROXICET  Take 2 tablets by mouth every 6 (six) hours as needed for pain.       No Known Allergies     Follow-up Information   Follow up with Almedia Balls, MD In 1 week. (please check repeat CBC)    Specialty:  Family Medicine   Contact information:   [address] [place] [address] [phone]        The results of significant diagnostics from this hospitalization (including imaging, microbiology, ancillary and laboratory) are listed below for reference.    Significant Diagnostic Studies: Ct Abdomen Pelvis Wo Contrast  06/02/2013   *RADIOLOGY REPORT*  Clinical Data: Abdominal pain with vomiting.  CT ABDOMEN AND PELVIS WITHOUT CONTRAST  Technique:  Multidetector CT imaging of the abdomen and pelvis was performed following the standard protocol without intravenous contrast.  Comparison: CT from 1 day prior.  Findings:  LOWER CHEST:  Mediastinum: Unremarkable.  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  The mass measures 2.3 cm in length. Followup recommendations given previously.  ABDOMEN/PELVIS:  Liver: Patchy low attenuation, better seen previously, consistent with steatosis.  Biliary: No evidence of biliary obstruction or stone.  Pancreas: Unremarkable.  Spleen: Unremarkable.  Adrenals: Unremarkable.  Kidneys and ureters: No hydronephrosis or stone.  Bladder: Unremarkable.  Bowel: No obstruction. Previously seen luminal irregularity in the ascending colon no longer evident.  No pericecal inflammatory changes.  Retroperitoneum: No mass or adenopathy.  Peritoneum: No free fluid or gas.  Reproductive: Unremarkable.  Vascular: Mild aortic and iliac atherosclerosis.  OSSEOUS: Post traumatic deformity to the right iliac wing. No suspicious lytic or blastic lesions.  IMPRESSION:  1.  No acute intra-abdominal findings. 2.  An  ascending colon abnormality described on the previous report is no longer seen. The prior appearance was likely related to stool or mucosal redundancy.   Original Report Authenticated By: Tiburcio Pea   Dg Abd 1 View  06/01/2013   *RADIOLOGY REPORT*  Clinical Data:  ABDOMEN - 1 VIEW  Comparison: Abdominal pain flank pain nausea and vomiting  Findings: Nonobstructive bowel gas pattern with no abnormally dilated loops of bowel.  No abnormal opacities.  IMPRESSION: Negative   Original Report Authenticated By: Esperanza Heir, M.D.   US Scrotum  06/02/2013   *RADIOLOGY REPORT*  Clinical Data:  Pain and swelling  SCROTAL ULTRASOUND DOPPLER ULTRASOUND OF THE TESTICLES  Technique: Complete ultrasound examination of the testicles, epididymis, and other scrotal structures was performed.  Color and spectral Doppler ultrasound were also utilized to evaluate blood flow to the testicles.  Comparison:  Findings:  Right testis:  4.9 x 2.7 x 2.8 cm.  Homogeneous echotexture.  Left testis:  5.0 x 2.6 x 2.5 cm.  Homogeneous echotexture.  Right epididymis:  Within normal limits with exception of a small 4 mm cyst.  Left epididymis:  Within normal limits with exception of a small 3 mm cyst.  Hydrocele:  Small right-sided hydrocele is noted.  Varicocele:  No varicocele is noted.  Pulsed Doppler interrogation of both testes demonstrates low resistance flow bilaterally.  IMPRESSION: No evidence of torsion.  Small epididymal cysts bilaterally.  Small right hydrocele.   Original Report Authenticated By: Alcide Clever, M.D.   [place] Art/ven Flow Abd Pelv Doppler  06/02/2013   *RADIOLOGY REPORT*  Clinical Data:  Pain and swelling  SCROTAL ULTRASOUND DOPPLER ULTRASOUND OF THE TESTICLES  Technique: Complete ultrasound examination of the testicles, epididymis, and other scrotal structures was performed.  Color and spectral Doppler ultrasound were also utilized to evaluate blood flow to the testicles.  Comparison:  Findings:  Right testis:   4.9 x 2.7 x 2.8 cm.  Homogeneous echotexture.  Left testis:  5.0 x 2.6 x 2.5 cm.  Homogeneous echotexture.  Right epididymis:  Within normal limits with exception of a small 4 mm cyst.  Left epididymis:  Within normal limits with exception of a small 3 mm cyst.  Hydrocele:  Small right-sided hydrocele is noted.  Varicocele:  No varicocele is noted.  Pulsed Doppler interrogation of both testes demonstrates low resistance flow bilaterally.  IMPRESSION: No evidence of torsion.  Small epididymal cysts bilaterally.  Small right hydrocele.   Original Report Authenticated By: Alcide Clever, M.D.    Microbiology: Recent Results (from the past 240 hour(s))  URINE CULTURE     Status: None   Collection Time    06/01/13  8:21 PM      Result Value Range Status   Specimen Description URINE, CLEAN CATCH   Final   Special Requests NONE   Final   Culture  Setup Time     Final   Value: 06/01/2013 23:53     Performed at [facility] Count     Final   Value: NO GROWTH     Performed at [facility]   Culture     Final   Value: NO GROWTH     Performed at [facility]   Report Status 06/02/2013 FINAL   Final  CULTURE, BLOOD (ROUTINE X 2)     Status: None   Collection Time    06/02/13  7:50 AM      Result Value Range Status   Specimen Description BLOOD LEFT ARM   Final   Special Requests BOTTLES DRAWN AEROBIC AND ANAEROBIC 10CC   Final   Culture  Setup Time     Final   Value: 06/02/2013 16:34     Performed at [facility]   Culture     Final   Value:        BLOOD CULTURE RECEIVED NO GROWTH TO DATE CULTURE WILL BE HELD FOR 5 DAYS BEFORE ISSUING A FINAL NEGATIVE REPORT     Performed at [facility]   Report Status PENDING   Incomplete  CULTURE, BLOOD (ROUTINE X 2)     Status: None   Collection Time    06/02/13  8:04 AM      Result Value Range Status   Specimen Description BLOOD LEFT HAND   Final   Special Requests BOTTLES DRAWN AEROBIC AND ANAEROBIC 10CC    Final   Culture  Setup Time     Final   Value: 06/02/2013 16:34  Performed at [facility]     Final   Value:        BLOOD CULTURE RECEIVED NO GROWTH TO DATE CULTURE WILL BE HELD FOR 5 DAYS BEFORE ISSUING A FINAL NEGATIVE REPORT     Performed at [facility]   Report Status PENDING   Incomplete  URINE CULTURE     Status: None   Collection Time    06/02/13  8:24 PM      Result Value Range Status   Specimen Description URINE, RANDOM   Final   Special Requests NONE   Final   Culture  Setup Time     Final   Value: 06/03/2013 02:11     Performed at [facility] Count     Final   Value: NO GROWTH     Performed at [facility]   Culture     Final   Value: NO GROWTH     Performed at [facility]   Report Status 06/03/2013 FINAL   Final     Labs: Basic Metabolic Panel:  Recent Labs Lab 06/01/13 2033 06/02/13 0750 06/03/13 0513 06/04/13 0702  NA 136  --  134* 132*  K 4.2  --  4.1 3.9  CL 99  --  101 98  CO2 28  --  24 23  GLUCOSE 129*  --  98 105*  BUN 11  --  13 10  CREATININE 1.60* 1.54* 1.47* 1.30  CALCIUM 9.9  --  8.7 9.3   Liver Function Tests:  Recent Labs Lab 06/01/13 2033 06/03/13 0513  AST 88* 67*  ALT 100* 148*  ALKPHOS 78 61  BILITOT 0.8 1.2  PROT 7.3 6.3  ALBUMIN 4.3 3.2*    Recent Labs Lab 06/01/13 2033  LIPASE 22   No results found for this basename: AMMONIA,  in the last 168 hours CBC:  Recent Labs Lab 06/01/13 2033 06/02/13 0750 06/03/13 0513 06/04/13 0702  WBC 20.5* 16.2* 14.9* 12.7*  NEUTROABS 17.8*  --   --   --   HGB 18.3* 17.1* 15.7 15.5  HCT 51.9 46.4 43.0 42.8  MCV 91.1 90.1 90.5 90.1  PLT 235 146* 112* 98*   Cardiac Enzymes: No results found for this basename: CKTOTAL, CKMB, CKMBINDEX, TROPONINI,  in the last 168 hours BNP: BNP (last 3 results) No results found for this basename: PROBNP,  in the last 8760 hours CBG: No results found for this basename:  GLUCAP,  in the last 168 hours     Signed:  Lanita Stammen  [facility] 06/04/2013, 11:34 AM

## 2013-06-05 LAB — DRUGS OF ABUSE SCREEN W/O ALC, ROUTINE URINE
Benzodiazepines.: POSITIVE — AB
Opiate Screen, Urine: POSITIVE — AB
Phencyclidine (PCP): NEGATIVE
Propoxyphene: NEGATIVE

## 2013-06-06 LAB — BENZODIAZEPINE, QUANTITATIVE, URINE
Diazepam (GC/LC/MS), ur confirm: NEGATIVE ng/mL
Flunitrazepam metabolite (GC/LC/MS), ur confirm: NEGATIVE ng/mL
Nordiazepam GC/MS Conf: 97 ng/mL
Oxazepam GC/MS Conf: 71 ng/mL

## 2013-06-06 LAB — OPIATE, QUANTITATIVE, URINE
6 Monoacetylmorphine, Ur-Confirm: NEGATIVE ng/mL
Morphine, Confirm: 999 ng/mL
Norhydrocodone, Ur: NEGATIVE ng/mL
Oxycodone, ur: NEGATIVE ng/mL
Oxymorphone: 121 ng/mL

## 2013-06-06 LAB — THC (MARIJUANA), URINE, CONFIRMATION: Marijuana, Ur-Confirmation: 43 ng/mL

## 2013-06-08 LAB — CULTURE, BLOOD (ROUTINE X 2): Culture: NO GROWTH

## 2021-05-24 ENCOUNTER — Emergency Department (HOSPITAL_BASED_OUTPATIENT_CLINIC_OR_DEPARTMENT_OTHER): Payer: No Typology Code available for payment source

## 2021-05-24 ENCOUNTER — Encounter (HOSPITAL_BASED_OUTPATIENT_CLINIC_OR_DEPARTMENT_OTHER): Payer: Self-pay

## 2021-05-24 ENCOUNTER — Observation Stay (HOSPITAL_BASED_OUTPATIENT_CLINIC_OR_DEPARTMENT_OTHER)
Admission: EM | Admit: 2021-05-24 | Discharge: 2021-05-26 | Disposition: A | Discharge status: Home / Self Care | Payer: No Typology Code available for payment source | Attending: Family Medicine | Admitting: Family Medicine

## 2021-05-24 ENCOUNTER — Other Ambulatory Visit: Payer: Self-pay

## 2021-05-24 DIAGNOSIS — F411 Generalized anxiety disorder: Secondary | ICD-10-CM | POA: Diagnosis present

## 2021-05-24 DIAGNOSIS — I214 Non-ST elevation (NSTEMI) myocardial infarction: Secondary | ICD-10-CM | POA: Diagnosis not present

## 2021-05-24 DIAGNOSIS — R079 Chest pain, unspecified: Secondary | ICD-10-CM | POA: Diagnosis not present

## 2021-05-24 DIAGNOSIS — R072 Precordial pain: Secondary | ICD-10-CM

## 2021-05-24 DIAGNOSIS — D751 Secondary polycythemia: Secondary | ICD-10-CM | POA: Insufficient documentation

## 2021-05-24 DIAGNOSIS — R7989 Other specified abnormal findings of blood chemistry: Secondary | ICD-10-CM | POA: Diagnosis present

## 2021-05-24 DIAGNOSIS — Z20822 Contact with and (suspected) exposure to covid-19: Secondary | ICD-10-CM | POA: Diagnosis not present

## 2021-05-24 DIAGNOSIS — R778 Other specified abnormalities of plasma proteins: Secondary | ICD-10-CM | POA: Diagnosis not present

## 2021-05-24 DIAGNOSIS — F17210 Nicotine dependence, cigarettes, uncomplicated: Secondary | ICD-10-CM | POA: Insufficient documentation

## 2021-05-24 DIAGNOSIS — Z720 Tobacco use: Secondary | ICD-10-CM | POA: Diagnosis present

## 2021-05-24 HISTORY — DX: Generalized anxiety disorder: F41.1

## 2021-05-24 HISTORY — DX: Tobacco use: Z72.0

## 2021-05-24 LAB — BASIC METABOLIC PANEL
Anion gap: 9 (ref 5–15)
BUN: 10 mg/dL (ref 6–20)
CO2: 27 mmol/L (ref 22–32)
Calcium: 9 mg/dL (ref 8.9–10.3)
Chloride: 100 mmol/L (ref 98–111)
Creatinine, Ser: 1.16 mg/dL (ref 0.61–1.24)
GFR, Estimated: >60 mL/min (ref >60)
Glucose, Bld: 102 mg/dL — ABNORMAL HIGH (ref 70–99)
Potassium: 4 mmol/L (ref 3.5–5.1)
Sodium: 136 mmol/L (ref 135–145)

## 2021-05-24 LAB — RESP PANEL BY RT-PCR (FLU A&B, COVID) ARPGX2
Influenza A by PCR: NEGATIVE
Influenza B by PCR: NEGATIVE
SARS Coronavirus 2 by RT PCR: NEGATIVE

## 2021-05-24 LAB — CBC
HCT: 50.4 % (ref 39.0–52.0)
Hemoglobin: 17.5 g/dL — ABNORMAL HIGH (ref 13.0–17.0)
MCH: 31.6 pg (ref 26.0–34.0)
MCHC: 34.7 g/dL (ref 30.0–36.0)
MCV: 91.1 fL (ref 80.0–100.0)
Platelets: 216 10*3/uL (ref 150–400)
RBC: 5.53 MIL/uL (ref 4.22–5.81)
RDW: 13.2 % (ref 11.5–15.5)
WBC: 8.2 10*3/uL (ref 4.0–10.5)
nRBC: 0 % (ref 0.0–0.2)

## 2021-05-24 LAB — TROPONIN I (HIGH SENSITIVITY): Troponin I (High Sensitivity): 180 ng/L (ref <18)

## 2021-05-24 LAB — PROTIME-INR
INR: 0.9 (ref 0.8–1.2)
Prothrombin Time: 12.2 seconds (ref 11.4–15.2)

## 2021-05-24 MED ORDER — ACETAMINOPHEN 325 MG PO TABS
650.0000 mg | ORAL_TABLET | Freq: Four times a day (QID) | ORAL | Status: DC | PRN
Start: 2021-05-24 — End: 2021-05-25

## 2021-05-24 MED ORDER — HEPARIN (PORCINE) 25000 UT/250ML-% IV SOLN
1800.0000 [IU]/h | INTRAVENOUS | Status: DC
  Administered 2021-05-24: 1400 [IU]/h via INTRAVENOUS
  Administered 2021-05-25: 1800 [IU]/h via INTRAVENOUS
  Filled 2021-05-24 (×2): qty 250

## 2021-05-24 MED ORDER — ACETAMINOPHEN 650 MG RE SUPP: 650.0000 mg | Freq: Four times a day (QID) | RECTAL | Status: DC | PRN

## 2021-05-24 MED ORDER — NICOTINE 14 MG/24HR TD PT24
14.0000 mg | MEDICATED_PATCH | Freq: Once | TRANSDERMAL | Status: AC
  Administered 2021-05-24: 14 mg via TRANSDERMAL
  Filled 2021-05-24: qty 1

## 2021-05-24 MED ORDER — ASPIRIN 81 MG PO CHEW
324.0000 mg | CHEWABLE_TABLET | Freq: Once | ORAL | Status: AC
  Administered 2021-05-24: 324 mg via ORAL
  Filled 2021-05-24: qty 4

## 2021-05-24 MED ORDER — HEPARIN SODIUM (PORCINE) 5000 UNIT/ML IJ SOLN
4000.0000 [IU] | Freq: Once | INTRAMUSCULAR | Status: AC
  Administered 2021-05-24: 4000 [IU] via INTRAVENOUS
  Filled 2021-05-24: qty 1

## 2021-05-24 MED ORDER — NITROGLYCERIN 0.4 MG SL SUBL
0.4000 mg | SUBLINGUAL_TABLET | SUBLINGUAL | Status: DC | PRN
  Administered 2021-05-24: 0.4 mg via SUBLINGUAL
  Filled 2021-05-24: qty 1

## 2021-05-24 MED ORDER — DIAZEPAM 5 MG PO TABS: 10.0000 mg | ORAL_TABLET | Freq: Three times a day (TID) | ORAL | Status: DC | PRN

## 2021-05-24 NOTE — ED Notes (Signed)
Covid swab obtained and taken to lab.

## 2021-05-24 NOTE — ED Triage Notes (Signed)
Pt c/o midsternal chest pain starting around 12pm today. Pain started radiating to bilateral shoulder and arms with some shortness of breath

## 2021-05-24 NOTE — ED Provider Notes (Addendum)
[facility] EMERGENCY DEPARTMENT Provider Note   CSN: [id number] Arrival date & time: 05/24/21  1317     History Chief Complaint  Patient presents with   Chest Pain    Edward Serrano is a 55 y.o. male.  Patient with onset of substernal chest pain 12 noon.  Lasted until about 3 PM in the afternoon.  Now completely gone except for maybe some discomfort in the top of both shoulders.  At the time that he had the chest pain that did radiate to both arms down through the shoulders.  Never had pain like this before.  Associated mild shortness of breath that is resolved completely.  No nausea or vomiting.  Cardiac risk factors only include current tobacco use.  Family history biological father's history not known.  But other family members without any premature coronary artery disease.  Patient states he currently feels much better.  Initial troponin out in triage was 180.  Raising concerns for an acute coronary artery syndrome.  Patient's initial EKG without any acute changes.  Patient's chest x-ray negative as well.  Blood pressure is elevated.      History reviewed. No pertinent past medical history.  Patient Active Problem List   Diagnosis Date Noted   UTI (urinary tract infection) 06/03/2013   SIRS (systemic inflammatory response syndrome) (HCC) 06/03/2013   Transaminitis 06/03/2013   Acute kidney injury (HCC) 06/03/2013   Abdominal pain, other specified site 06/02/2013    Past Surgical History:  Procedure Laterality Date   ANKLE RECONSTRUCTION     APPENDECTOMY         History reviewed. No pertinent family history.  Social History   Tobacco Use   Smoking status: Every Day    Packs/day: 1.00    Types: Cigarettes   Smokeless tobacco: Never  Substance Use Topics   Alcohol use: Yes    Comment: occasional   Drug use: Yes    Types: Marijuana    Home Medications Prior to Admission medications   Medication Sig Start Date End Date Taking? Authorizing Provider   ciprofloxacin (CIPRO) 500 MG tablet Take 1 tablet (500 mg total) by mouth 2 (two) times daily. 06/04/13   Dhungel, Nishant, MD  diazepam (VALIUM) 5 MG tablet Take 10 mg by mouth every 8 (eight) hours as needed for anxiety.    [provider]  oxyCODONE-acetaminophen (PERCOCET/ROXICET) 5-325 MG per tablet Take 2 tablets by mouth every 6 (six) hours as needed for pain. 06/04/13   Dhungel, Theda Belfast, MD    Allergies    Patient has no known allergies.  Review of Systems   Review of Systems  Constitutional:  Negative for chills and fever.  HENT:  Negative for ear pain and sore throat.   Eyes:  Negative for pain and visual disturbance.  Respiratory:  Positive for shortness of breath. Negative for cough.   Cardiovascular:  Positive for chest pain. Negative for palpitations and leg swelling.  Gastrointestinal:  Negative for abdominal pain and vomiting.  Genitourinary:  Negative for dysuria and hematuria.  Musculoskeletal:  Negative for arthralgias and back pain.  Skin:  Negative for color change and rash.  Neurological:  Negative for seizures and syncope.  All other systems reviewed and are negative.  Physical Exam Updated Vital Signs BP (!) 128/94   Pulse 65   Temp 98.3 F (36.8 C) (Oral)   Resp 14   Ht 1.829 m (6')   Wt 107 kg   SpO2 97%   BMI 31.99 kg/m  Physical Exam Vitals and nursing note reviewed.  Constitutional:      Appearance: Normal appearance. He is well-developed.  HENT:     Head: Normocephalic and atraumatic.  Eyes:     Extraocular Movements: Extraocular movements intact.     Conjunctiva/sclera: Conjunctivae normal.     Pupils: Pupils are equal, round, and reactive to light.  Cardiovascular:     Rate and Rhythm: Normal rate and regular rhythm.     Heart sounds: No murmur heard. Pulmonary:     Effort: Pulmonary effort is normal. No respiratory distress.     Breath sounds: Normal breath sounds. No wheezing.  Abdominal:     Palpations: Abdomen is soft.      Tenderness: There is no abdominal tenderness.  Musculoskeletal:        General: No swelling.     Cervical back: Normal range of motion and neck supple.  Skin:    General: Skin is warm and dry.     Capillary Refill: Capillary refill takes less than 2 seconds.  Neurological:     General: No focal deficit present.     Mental Status: He is alert and oriented to person, place, and time.     Cranial Nerves: No cranial nerve deficit.     Sensory: No sensory deficit.     Motor: No weakness.    ED Results / Procedures / Treatments   Labs (all labs ordered are listed, but only abnormal results are displayed) Labs Reviewed  BASIC METABOLIC PANEL - Abnormal; Notable for the following components:      Result Value   Glucose, Bld 102 (*)    All other components within normal limits  CBC - Abnormal; Notable for the following components:   Hemoglobin 17.5 (*)    All other components within normal limits  TROPONIN I (HIGH SENSITIVITY) - Abnormal; Notable for the following components:   Troponin I (High Sensitivity) 180 (*)    All other components within normal limits  TROPONIN I (HIGH SENSITIVITY) - Abnormal; Notable for the following components:   Troponin I (High Sensitivity) 338 (*)    All other components within normal limits    EKG EKG Interpretation  Date/Time:  Monday May 24 2021 13:24:45 EDT Ventricular Rate:  81 PR Interval:  158 QRS Duration: 80 QT Interval:  372 QTC Calculation: 432 R Axis:   29 Text Interpretation: Normal sinus rhythm Normal ECG Confirmed by Virgina Norfolk ([id number]) on 05/24/2021 2:17:32 PM  Radiology DG Chest 2 View  Result Date: 05/24/2021 CLINICAL DATA:  Chest pain. EXAM: CHEST - 2 VIEW COMPARISON:  None. FINDINGS: The heart size and mediastinal contours are within normal limits. Both lungs are clear. The visualized skeletal structures are unremarkable. IMPRESSION: No active cardiopulmonary disease. Electronically Signed   By: Obie Dredge M.D.   On:  05/24/2021 14:25    Procedures Procedures   Medications Ordered in ED Medications  nitroGLYCERIN (NITROSTAT) SL tablet 0.4 mg (0.4 mg Sublingual Given 05/24/21 1550)  heparin injection 4,000 Units (has no administration in time range)  heparin ADULT infusion 100 units/mL (25000 units/28mL) (has no administration in time range)  aspirin chewable tablet 324 mg (324 mg Oral Given 05/24/21 1550)    ED Course  I have reviewed the triage vital signs and the nursing notes.  Pertinent labs & imaging results that were available during my care of the patient were reviewed by me and considered in my medical decision making (see chart for details).    MDM  Rules/Calculators/A&P                          CRITICAL CARE Performed by: Vanetta Mulders Total critical care time: 45 minutes Critical care time was exclusive of separately billable procedures and treating other patients. Critical care was necessary to treat or prevent imminent or life-threatening deterioration. Critical care was time spent personally by me on the following activities: development of treatment plan with patient and/or surrogate as well as nursing, discussions with consultants, evaluation of patient's response to treatment, examination of patient, obtaining history from patient or surrogate, ordering and performing treatments and interventions, ordering and review of laboratory studies, ordering and review of radiographic studies, pulse oximetry and re-evaluation of patient's condition.   Patient's initial troponin 180.  Patient's story with onset of chest pain and probably resolution at 1500 very concerning for acute cardiac event.  Patient's main cardiac risk factors current tobacco use.  The shortness of breath that he had has resolved.  Oxygen saturations are normal.  EKG without tachycardia.  Will treat with aspirin and also will go ahead and give sublingual nitroglycerin since he still has a slight discomfort at the top of  both shoulders.  This could be anginal equivalent.  Concern is for a non-STEMI event.  Second troponin increased to 338.  Patient remains chest pain-free.  We will start heparin for non-STEMI.  Contact cardiology for admission most likely will be hospitalist admission.  Patient post the game plan.  And his wife.  Final Clinical Impression(s) / ED Diagnoses Final diagnoses:  Precordial pain  NSTEMI (non-ST elevated myocardial infarction) [facility])    Rx / DC Orders ED Discharge Orders     None        Vanetta Mulders, MD 05/24/21 1549    Vanetta Mulders, MD 05/24/21 1549    Vanetta Mulders, MD 05/24/21 1659

## 2021-05-24 NOTE — Progress Notes (Signed)
ANTICOAGULATION CONSULT NOTE - Initial Consult  Pharmacy Consult for Heparin Indication: chest pain/ACS  No Known Allergies  Patient Measurements: Height: 6' (182.9 cm) Weight: 107 kg (235 lb 14.3 oz) IBW/kg (Calculated) : 77.6 Heparin Dosing Weight: 99.2  Vital Signs: Temp: 98.3 F (36.8 C) (08/22 1325) Temp Source: Oral (08/22 1325) BP: 128/94 (08/22 1630) Pulse Rate: 65 (08/22 1630)  Labs: Recent Labs    05/24/21 1411 05/24/21 1559  HGB 17.5*  --   HCT 50.4  --   PLT 216  --   CREATININE 1.16  --   TROPONINIHS 180* 338*    Estimated Creatinine Clearance: 89.9 mL/min (by C-G formula based on SCr of 1.16 mg/dL).   Medical History: History reviewed. No pertinent past medical history.  Medications:  (Not in a hospital admission)  Scheduled:   heparin  4,000 Units Intravenous Once   Infusions:   heparin      Assessment: 55 yom presenting with substernal chest pain. Heparin consult for ACS/NSTEMI placed for pharmacy dosing.  Not on anticoagulation prior to arrival. H/H 17.5/50.4; plt 216  Goal of Therapy:  Heparin level 0.3-0.7 units/ml Monitor platelets by anticoagulation protocol: Yes   Plan:  Give 4000 units bolus x 1 Start heparin infusion at 1400 units/hr Check anti-Xa level in 6-8 hours and daily while on heparin Continue to monitor H&H and platelets  Delmar Landau, PharmD, BCPS 05/24/2021 5:06 PM ED Clinical Pharmacist -  [phone]
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
## 2021-05-24 NOTE — Progress Notes (Signed)
Patient is a 55 year old male with no significant past medical history who presented at [facility] with chest pain that started at noon today.  Chest pain was radiating to both arms.  Patient was given aspirin, nitroglycerin.  Troponins were followed to be elevated.  Noted to be hypertensive in the emergency.  He is a smoker.  EKG did not show any ischemic changes.  Patient is being admitted for chest pain rule out.  Cardiology has been consulted and will follow.  Might need cath tomorrow.  We will keep n.p.o. after midnight.

## 2021-05-25 ENCOUNTER — Encounter (HOSPITAL_COMMUNITY)
Admission: EM | Disposition: A | Discharge status: Home / Self Care | Payer: Self-pay | Source: Home / Self Care | Attending: Emergency Medicine

## 2021-05-25 ENCOUNTER — Observation Stay (HOSPITAL_BASED_OUTPATIENT_CLINIC_OR_DEPARTMENT_OTHER): Payer: No Typology Code available for payment source

## 2021-05-25 ENCOUNTER — Encounter (HOSPITAL_COMMUNITY): Payer: Self-pay | Admitting: Internal Medicine

## 2021-05-25 DIAGNOSIS — I214 Non-ST elevation (NSTEMI) myocardial infarction: Secondary | ICD-10-CM | POA: Diagnosis present

## 2021-05-25 DIAGNOSIS — R079 Chest pain, unspecified: Secondary | ICD-10-CM | POA: Diagnosis not present

## 2021-05-25 DIAGNOSIS — D751 Secondary polycythemia: Secondary | ICD-10-CM | POA: Diagnosis present

## 2021-05-25 DIAGNOSIS — Z720 Tobacco use: Secondary | ICD-10-CM | POA: Diagnosis not present

## 2021-05-25 DIAGNOSIS — R778 Other specified abnormalities of plasma proteins: Secondary | ICD-10-CM | POA: Diagnosis not present

## 2021-05-25 DIAGNOSIS — F411 Generalized anxiety disorder: Secondary | ICD-10-CM | POA: Diagnosis present

## 2021-05-25 DIAGNOSIS — R7989 Other specified abnormal findings of blood chemistry: Secondary | ICD-10-CM | POA: Diagnosis present

## 2021-05-25 DIAGNOSIS — I2510 Atherosclerotic heart disease of native coronary artery without angina pectoris: Secondary | ICD-10-CM | POA: Diagnosis not present

## 2021-05-25 HISTORY — PX: LEFT HEART CATH AND CORONARY ANGIOGRAPHY: CATH118249

## 2021-05-25 LAB — CBC
HCT: 46.5 % (ref 39.0–52.0)
Hemoglobin: 16.6 g/dL (ref 13.0–17.0)
MCH: 32.5 pg (ref 26.0–34.0)
MCHC: 35.7 g/dL (ref 30.0–36.0)
MCV: 91 fL (ref 80.0–100.0)
Platelets: 210 10*3/uL (ref 150–400)
RBC: 5.11 MIL/uL (ref 4.22–5.81)
RDW: 13.1 % (ref 11.5–15.5)
WBC: 7 10*3/uL (ref 4.0–10.5)
nRBC: 0 % (ref 0.0–0.2)

## 2021-05-25 LAB — ECHOCARDIOGRAM COMPLETE
Area-P 1/2: 3.23 cm2
Height: 72 in
S' Lateral: 3.5 cm
Weight: 3705.6 oz

## 2021-05-25 LAB — COMPREHENSIVE METABOLIC PANEL
ALT: 27 U/L (ref 0–44)
AST: 24 U/L (ref 15–41)
Albumin: 3.5 g/dL (ref 3.5–5.0)
Alkaline Phosphatase: 78 U/L (ref 38–126)
Anion gap: 8 (ref 5–15)
BUN: 9 mg/dL (ref 6–20)
CO2: 25 mmol/L (ref 22–32)
Calcium: 8.9 mg/dL (ref 8.9–10.3)
Chloride: 104 mmol/L (ref 98–111)
Creatinine, Ser: 1.2 mg/dL (ref 0.61–1.24)
GFR, Estimated: >60 mL/min (ref >60)
Glucose, Bld: 111 mg/dL — ABNORMAL HIGH (ref 70–99)
Potassium: 3.6 mmol/L (ref 3.5–5.1)
Sodium: 137 mmol/L (ref 135–145)
Total Bilirubin: 0.8 mg/dL (ref 0.3–1.2)
Total Protein: 6.1 g/dL — ABNORMAL LOW (ref 6.5–8.1)

## 2021-05-25 LAB — SEDIMENTATION RATE: Sed Rate: 2 mm/hr (ref 0–16)

## 2021-05-25 LAB — RAPID URINE DRUG SCREEN, HOSP PERFORMED
Amphetamines: NOT DETECTED
Barbiturates: NOT DETECTED
Benzodiazepines: NOT DETECTED
Cocaine: NOT DETECTED
Opiates: NOT DETECTED
Tetrahydrocannabinol: POSITIVE — AB

## 2021-05-25 LAB — HIV ANTIBODY (ROUTINE TESTING W REFLEX): HIV Screen 4th Generation wRfx: NONREACTIVE

## 2021-05-25 LAB — LIPID PANEL
Cholesterol: 130 mg/dL (ref 0–200)
HDL: 24 mg/dL — ABNORMAL LOW (ref >40)
LDL Cholesterol: 85 mg/dL (ref 0–99)
Total CHOL/HDL Ratio: 5.4 RATIO
Triglycerides: 105 mg/dL (ref <150)
VLDL: 21 mg/dL (ref 0–40)

## 2021-05-25 LAB — MAGNESIUM: Magnesium: 2.2 mg/dL (ref 1.7–2.4)

## 2021-05-25 LAB — HEPARIN LEVEL (UNFRACTIONATED)
Heparin Unfractionated: <0.1 IU/mL — ABNORMAL LOW (ref 0.30–0.70)
Heparin Unfractionated: 0.42 IU/mL (ref 0.30–0.70)

## 2021-05-25 LAB — TROPONIN I (HIGH SENSITIVITY)
Troponin I (High Sensitivity): 338 ng/L (ref <18)
Troponin I (High Sensitivity): 975 ng/L (ref <18)

## 2021-05-25 SURGERY — LEFT HEART CATH AND CORONARY ANGIOGRAPHY
Anesthesia: LOCAL

## 2021-05-25 MED ORDER — ONDANSETRON HCL 4 MG/2ML IJ SOLN: 4.0000 mg | Freq: Four times a day (QID) | INTRAMUSCULAR | Status: DC | PRN

## 2021-05-25 MED ORDER — SODIUM CHLORIDE 0.9% FLUSH
3.0000 mL | Freq: Two times a day (BID) | INTRAVENOUS | Status: DC
  Administered 2021-05-25: 3 mL via INTRAVENOUS

## 2021-05-25 MED ORDER — MIDAZOLAM HCL 2 MG/2ML IJ SOLN
INTRAMUSCULAR | Status: AC
  Filled 2021-05-25: qty 2

## 2021-05-25 MED ORDER — IOHEXOL 350 MG/ML SOLN
INTRAVENOUS | Status: DC | PRN
  Administered 2021-05-25: 45 mL

## 2021-05-25 MED ORDER — SODIUM CHLORIDE 0.9% FLUSH
3.0000 mL | INTRAVENOUS | Status: DC | PRN
Start: 2021-05-25 — End: 2021-05-25

## 2021-05-25 MED ORDER — VERAPAMIL HCL 2.5 MG/ML IV SOLN
INTRAVENOUS | Status: DC | PRN
  Administered 2021-05-25: 10 mL via INTRA_ARTERIAL

## 2021-05-25 MED ORDER — METOPROLOL TARTRATE 12.5 MG HALF TABLET
12.5000 mg | ORAL_TABLET | Freq: Two times a day (BID) | ORAL | Status: DC
  Administered 2021-05-25 – 2021-05-26 (×3): 12.5 mg via ORAL
  Filled 2021-05-25 (×3): qty 1

## 2021-05-25 MED ORDER — HEPARIN SODIUM (PORCINE) 1000 UNIT/ML IJ SOLN
INTRAMUSCULAR | Status: DC | PRN
  Administered 2021-05-25: 5000 [IU] via INTRAVENOUS

## 2021-05-25 MED ORDER — HEPARIN BOLUS VIA INFUSION
3000.0000 [IU] | Freq: Once | INTRAVENOUS | Status: AC
  Administered 2021-05-25: 3000 [IU] via INTRAVENOUS
  Filled 2021-05-25: qty 3000

## 2021-05-25 MED ORDER — ATORVASTATIN CALCIUM 80 MG PO TABS
80.0000 mg | ORAL_TABLET | Freq: Every day | ORAL | Status: DC
  Administered 2021-05-25 – 2021-05-26 (×2): 80 mg via ORAL
  Filled 2021-05-25 (×2): qty 1

## 2021-05-25 MED ORDER — ASPIRIN EC 81 MG PO TBEC
81.0000 mg | DELAYED_RELEASE_TABLET | Freq: Every day | ORAL | Status: DC
  Administered 2021-05-26: 81 mg via ORAL
  Filled 2021-05-25: qty 1

## 2021-05-25 MED ORDER — FENTANYL CITRATE (PF) 100 MCG/2ML IJ SOLN
INTRAMUSCULAR | Status: DC | PRN
  Administered 2021-05-25: 25 ug via INTRAVENOUS

## 2021-05-25 MED ORDER — CLOPIDOGREL BISULFATE 75 MG PO TABS
75.0000 mg | ORAL_TABLET | Freq: Every day | ORAL | Status: DC
  Administered 2021-05-26: 75 mg via ORAL
  Filled 2021-05-25: qty 1

## 2021-05-25 MED ORDER — PERFLUTREN LIPID MICROSPHERE
1.0000 mL | INTRAVENOUS | Status: AC | PRN
  Administered 2021-05-25: 2 mL via INTRAVENOUS
  Filled 2021-05-25: qty 10

## 2021-05-25 MED ORDER — SODIUM CHLORIDE 0.9 % WEIGHT BASED INFUSION
3.0000 mL/kg/h | INTRAVENOUS | Status: DC
  Administered 2021-05-25: 3 mL/kg/h via INTRAVENOUS

## 2021-05-25 MED ORDER — ASPIRIN 81 MG PO CHEW
81.0000 mg | CHEWABLE_TABLET | ORAL | Status: AC
  Administered 2021-05-25: 81 mg via ORAL
  Filled 2021-05-25: qty 1

## 2021-05-25 MED ORDER — CLOPIDOGREL BISULFATE 300 MG PO TABS
ORAL_TABLET | ORAL | Status: DC | PRN
  Administered 2021-05-25: 300 mg via ORAL

## 2021-05-25 MED ORDER — SODIUM CHLORIDE 0.9 % IV SOLN: 250.0000 mL | INTRAVENOUS | Status: DC | PRN

## 2021-05-25 MED ORDER — MIDAZOLAM HCL 2 MG/2ML IJ SOLN
INTRAMUSCULAR | Status: DC | PRN
  Administered 2021-05-25: 2 mg via INTRAVENOUS

## 2021-05-25 MED ORDER — LIDOCAINE HCL (PF) 1 % IJ SOLN
INTRAMUSCULAR | Status: AC
  Filled 2021-05-25: qty 30

## 2021-05-25 MED ORDER — HYDRALAZINE HCL 20 MG/ML IJ SOLN: 10.0000 mg | INTRAMUSCULAR | Status: AC | PRN

## 2021-05-25 MED ORDER — LABETALOL HCL 5 MG/ML IV SOLN: 10.0000 mg | INTRAVENOUS | Status: AC | PRN

## 2021-05-25 MED ORDER — SODIUM CHLORIDE 0.9 % WEIGHT BASED INFUSION: 1.0000 mL/kg/h | INTRAVENOUS | Status: DC

## 2021-05-25 MED ORDER — FENTANYL CITRATE (PF) 100 MCG/2ML IJ SOLN
INTRAMUSCULAR | Status: AC
  Filled 2021-05-25: qty 2

## 2021-05-25 MED ORDER — SODIUM CHLORIDE 0.9% FLUSH: 3.0000 mL | INTRAVENOUS | Status: DC | PRN

## 2021-05-25 MED ORDER — LIDOCAINE HCL (PF) 1 % IJ SOLN
INTRAMUSCULAR | Status: DC | PRN
  Administered 2021-05-25: 2 mL

## 2021-05-25 MED ORDER — HEPARIN (PORCINE) IN NACL 1000-0.9 UT/500ML-% IV SOLN
INTRAVENOUS | Status: DC | PRN
  Administered 2021-05-25 (×2): 500 mL

## 2021-05-25 MED ORDER — HEPARIN SODIUM (PORCINE) 1000 UNIT/ML IJ SOLN
INTRAMUSCULAR | Status: AC
  Filled 2021-05-25: qty 1

## 2021-05-25 MED ORDER — ACETAMINOPHEN 325 MG PO TABS: 650.0000 mg | ORAL_TABLET | ORAL | Status: DC | PRN

## 2021-05-25 MED ORDER — CLOPIDOGREL BISULFATE 300 MG PO TABS
ORAL_TABLET | ORAL | Status: AC
  Filled 2021-05-25: qty 1

## 2021-05-25 MED ORDER — SODIUM CHLORIDE 0.9 % IV SOLN: INTRAVENOUS | Status: AC

## 2021-05-25 MED ORDER — VERAPAMIL HCL 2.5 MG/ML IV SOLN
INTRAVENOUS | Status: AC
  Filled 2021-05-25: qty 2

## 2021-05-25 SURGICAL SUPPLY — 9 items
CATH 5FR JL3.5 JR4 ANG PIG MP (CATHETERS) ×2 IMPLANT
DEVICE RAD COMP TR BAND LRG (VASCULAR PRODUCTS) ×2 IMPLANT
GLIDESHEATH SLEND SS 6F .021 (SHEATH) ×2 IMPLANT
GUIDEWIRE INQWIRE 1.5J.035X260 (WIRE) ×1 IMPLANT
INQWIRE 1.5J .035X260CM (WIRE) ×2
KIT HEART LEFT (KITS) ×2 IMPLANT
PACK CARDIAC CATHETERIZATION (CUSTOM PROCEDURE TRAY) ×2 IMPLANT
TRANSDUCER W/STOPCOCK (MISCELLANEOUS) ×2 IMPLANT
TUBING CIL FLEX 10 FLL-RA (TUBING) ×2 IMPLANT

## 2021-05-25 NOTE — Interval H&P Note (Signed)
Cath Lab Visit (complete for each Cath Lab visit)  Clinical Evaluation Leading to the Procedure:   ACS: Yes.    Non-ACS:    Anginal Classification: CCS IV  Anti-ischemic medical therapy: Minimal Therapy (1 class of medications)  Non-Invasive Test Results: No non-invasive testing performed  Prior CABG: No previous CABG      History and Physical Interval Note:  05/25/2021 1:56 PM  Edward [facility]errano  has presented today for surgery, with the diagnosis of unstable angina.  The various methods of treatment have been discussed with the patient and family. After consideration of risks, benefits and other options for treatment, the patient has consented to  Procedure(s): LEFT HEART CATH AND CORONARY ANGIOGRAPHY (N/A) as a surgical intervention.  The patient's history has been reviewed, patient examined, no change in status, stable for surgery.  I have reviewed the patient's chart and labs.  Questions were answered to the patient's satisfaction.     Lance Muss
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
## 2021-05-25 NOTE — Progress Notes (Signed)
  Echocardiogram 2D Echocardiogram has been performed.  Augustine Radar 05/25/2021, 10:13 AM

## 2021-05-25 NOTE — Progress Notes (Signed)
ANTICOAGULATION CONSULT NOTE  Pharmacy Consult for Heparin Indication: chest pain/ACS  No Known Allergies  Patient Measurements: Height: 6' (182.9 cm) Weight: 105.1 kg (231 lb 9.6 oz) IBW/kg (Calculated) : 77.6 Heparin Dosing Weight: 99.2  Vital Signs: Temp: 98.2 F (36.8 C) (08/22 2115) Temp Source: Oral (08/22 2115) BP: 148/91 (08/22 2115) Pulse Rate: 89 (08/22 2115)  Labs: Recent Labs    05/24/21 1411 05/24/21 1559 05/25/21 0000  HGB 17.5*  --  16.6  HCT 50.4  --  46.5  PLT 216  --  210  LABPROT 12.2  --   --   INR 0.9  --   --   HEPARINUNFRC  --   --  <0.10*  CREATININE 1.16  --   --   TROPONINIHS 180* 338*  --      Estimated Creatinine Clearance: 89.1 mL/min (by C-G formula based on SCr of 1.16 mg/dL).   Medical History: Past Medical History:  Diagnosis Date   GAD (generalized anxiety disorder)    Tobacco abuse     Assessment: 73 yom presenting with substernal chest pain. Heparin consult for ACS/NSTEMI placed for pharmacy dosing.  Heparin level undetectable on gtt at 1400 units/hr. No issues with line or bleeding reported per RN.  Goal of Therapy:  Heparin level 0.3-0.7 units/ml Monitor platelets by anticoagulation protocol: Yes   Plan:  Rebolus heparin 3000 units Increase heparin gtt to1800 unit/s hr Will f/u 6 hr heparin level  Christoper Fabian, PharmD, BCPS Please see amion for complete clinical pharmacist phone list 05/25/2021 1:16 AM

## 2021-05-25 NOTE — Progress Notes (Signed)
Critical value- Troponin 975- paged [facility] on call- Dr Robb Matar with this information

## 2021-05-25 NOTE — Progress Notes (Signed)
ANTICOAGULATION CONSULT NOTE  Pharmacy Consult for Heparin Indication: chest pain/ACS  Allergies  Allergen Reactions   Shellfish Allergy Swelling    Patient Measurements: Height: 6' (182.9 cm) Weight: 105.1 kg (231 lb 9.6 oz) IBW/kg (Calculated) : 77.6 Heparin Dosing Weight: 99.2  Vital Signs: Temp: 98.3 F (36.8 C) (08/23 1301) Temp Source: Oral (08/23 1301) BP: 130/86 (08/23 1301) Pulse Rate: 78 (08/23 1301)  Labs: Recent Labs    05/24/21 1411 05/24/21 1559 05/25/21 0000 05/25/21 0511 05/25/21 0751  HGB 17.5*  --  16.6  --   --   HCT 50.4  --  46.5  --   --   PLT 216  --  210  --   --   LABPROT 12.2  --   --   --   --   INR 0.9  --   --   --   --   HEPARINUNFRC  --   --  <0.10*  --  0.42  CREATININE 1.16  --  1.20  --   --   TROPONINIHS 180* 338*  --  975*  --      Estimated Creatinine Clearance: 86.1 mL/min (by C-G formula based on SCr of 1.2 mg/dL).   Medical History: Past Medical History:  Diagnosis Date   GAD (generalized anxiety disorder)    Tobacco abuse     Assessment: 39 yom presenting with substernal chest pain. Heparin consult for ACS/NSTEMI placed for pharmacy dosing.  Heparin level 0.4 at goal on heparin drip 1800 uts/hr  No issues with line or bleeding reported per RN.  Goal of Therapy:  Heparin level 0.3-0.7 units/ml Monitor platelets by anticoagulation protocol: Yes   Plan:  Continue heparin gtt to1800 unit/s hr Follow up post cath    Leota Sauers Pharm.D. CPP, BCPS Clinical Pharmacist [phone] 05/25/2021 1:06 PM

## 2021-05-25 NOTE — Progress Notes (Addendum)
[facility] PROGRESS NOTE   Edward Serrano QJJ:[id number] DOB: Apr 18, 1966 DOA: 05/24/2021  PCP: Wilburn Mylar, MD  Brief History/Interval Summary: 55 year old male who with no significant past medical history except for tobacco abuse presented with chest pain.  Hospitalized for further management.  Consultants: Cardiology  Procedures: Transthoracic echocardiogram is pending.  Antibiotics: Anti-infectives (From admission, onward)    None       Subjective/Interval History: Patient denies any chest pain currently.    No shortness of breath.  No nausea or vomiting. His wife is at the bedside.     Assessment/Plan:  Chest pain/NSTEMI Patient with family history of heart disease.  He smokes to 1-1/2 packs of cigarettes on a daily basis.  However does not have any other chronic medical problems such as hypertension or diabetes. Had elevated troponin levels.  Cardiology has seen the patient.  Plan is for cardiac catheterization.  Continue aspirin.  Patient on heparin.  Patient on beta-blocker and statin.  Lipid panel is pending. Echocardiogram shows normal EF. Glucose level noted to be slightly elevated.  Will check HbA1c.  Tobacco abuse Patient was counseled.  Does not appear to be willing to stop smoking at this time.  We will continue to have conversations with him.  Obesity Estimated body mass index is 31.41 kg/m as calculated from the following:   Height as of this encounter: 6' (1.829 m).   Weight as of this encounter: 105.1 kg.   DVT Prophylaxis: On IV heparin Code Status: Full code Family Communication: Discussed with patient and his wife Disposition Plan: Hopefully return home when improved  Status is: Observation  The patient will require care spanning > 2 midnights and should be moved to inpatient because: Ongoing diagnostic testing needed not appropriate for outpatient work up  Dispo: The patient is from: Home              Anticipated d/c is to:  Home              Patient currently is not medically stable to d/c.   Difficult to place patient No       Medications: Scheduled:  aspirin  81 mg Oral Pre-Cath   atorvastatin  80 mg Oral Daily   metoprolol tartrate  12.5 mg Oral BID   nicotine  14 mg Transdermal Once   sodium chloride flush  3 mL Intravenous Q12H   Continuous:  sodium chloride     sodium chloride     Followed by   sodium chloride     heparin 1,800 Units/hr (05/25/21 0706)   GYJ:EHUDJS chloride, acetaminophen **OR** acetaminophen, diazepam, nitroGLYCERIN, perflutren lipid microspheres (DEFINITY) IV suspension, sodium chloride flush   Objective:  Vital Signs  Vitals:   05/24/21 2000 05/24/21 2115 05/25/21 0450 05/25/21 0826  BP: (!) 152/95 (!) 148/91  132/89  Pulse: 79 89  78  Resp: 18 16  18   Temp:  98.2 F (36.8 C) 98.1 F (36.7 C) 98.3 F (36.8 C)  TempSrc:  Oral Oral Oral  SpO2: 100% 100%    Weight:  105.1 kg 105.1 kg   Height:  6' (1.829 m)     No intake or output data in the 24 hours ending 05/25/21 1151 Filed Weights   05/24/21 1547 05/24/21 2115 05/25/21 0450  Weight: 107 kg 105.1 kg 105.1 kg    General appearance: Awake alert.  In no distress Resp: Clear to auscultation bilaterally.  Normal effort Cardio: S1-S2 is normal regular.  No S3-S4.  No rubs murmurs or bruit GI: Abdomen is soft.  Nontender nondistended.  Bowel sounds are present normal.  No masses organomegaly Extremities: No edema.  Full range of motion of lower extremities. Neurologic: Alert and oriented x3.  No focal neurological deficits.    Lab Results:  Data Reviewed: I have personally reviewed following labs and imaging studies  CBC: Recent Labs  Lab 05/24/21 1411 05/25/21 0000  WBC 8.2 7.0  HGB 17.5* 16.6  HCT 50.4 46.5  MCV 91.1 91.0  PLT 216 210    Basic Metabolic Panel: Recent Labs  Lab 05/24/21 1411 05/25/21 0000  NA 136 137  K 4.0 3.6  CL 100 104  CO2 27 25  GLUCOSE 102* 111*  BUN 10 9   CREATININE 1.16 1.20  CALCIUM 9.0 8.9  MG  --  2.2    GFR: Estimated Creatinine Clearance: 86.1 mL/min (by C-G formula based on SCr of 1.2 mg/dL).  Liver Function Tests: Recent Labs  Lab 05/25/21 0000  AST 24  ALT 27  ALKPHOS 78  BILITOT 0.8  PROT 6.1*  ALBUMIN 3.5      Coagulation Profile: Recent Labs  Lab 05/24/21 1411  INR 0.9      Recent Results (from the past 240 hour(s))  Resp Panel by RT-PCR (Flu A&B, Covid) Nasopharyngeal Swab     Status: None   Collection Time: 05/24/21  5:35 PM   Specimen: Nasopharyngeal Swab; Nasopharyngeal(NP) swabs in vial transport medium  Result Value Ref Range Status   SARS Coronavirus 2 by RT PCR NEGATIVE NEGATIVE Final    Comment: (NOTE) SARS-CoV-2 target nucleic acids are NOT DETECTED.  The SARS-CoV-2 RNA is generally detectable in upper respiratory specimens during the acute phase of infection. The lowest concentration of SARS-CoV-2 viral copies this assay can detect is 138 copies/mL. A negative result does not preclude SARS-Cov-2 infection and should not be used as the sole basis for treatment or other patient management decisions. A negative result may occur with  improper specimen collection/handling, submission of specimen other than nasopharyngeal swab, presence of viral mutation(s) within the areas targeted by this assay, and inadequate number of viral copies(<138 copies/mL). A negative result must be combined with clinical observations, patient history, and epidemiological information. The expected result is Negative.  Fact Sheet for Patients:  BloggerCourse.comhttps://www.fda.gov/media/152166/download  Fact Sheet for Healthcare Providers:  SeriousBroker.ithttps://www.fda.gov/media/152162/download  This test is no t yet approved or cleared by the [place] FDA and  has been authorized for detection and/or diagnosis of SARS-CoV-2 by FDA under an Emergency Use Authorization (EUA). This EUA will remain  in effect (meaning this test can be  used) for the duration of the COVID-19 declaration under Section 564(b)(1) of the Act, 21 U.S.C.section 360bbb-3(b)(1), unless the authorization is terminated  or revoked sooner.       Influenza A by PCR NEGATIVE NEGATIVE Final   Influenza B by PCR NEGATIVE NEGATIVE Final    Comment: (NOTE) The Xpert Xpress SARS-CoV-2/FLU/RSV plus assay is intended as an aid in the diagnosis of influenza from Nasopharyngeal swab specimens and should not be used as a sole basis for treatment. Nasal washings and aspirates are unacceptable for Xpert Xpress SARS-CoV-2/FLU/RSV testing.  Fact Sheet for Patients: BloggerCourse.comhttps://www.fda.gov/media/152166/download  Fact Sheet for Healthcare Providers: SeriousBroker.ithttps://www.fda.gov/media/152162/download  This test is not yet approved or cleared by the [place] FDA and has been authorized for detection and/or diagnosis of SARS-CoV-2 by FDA under an Emergency Use Authorization (EUA). This EUA will remain in effect (meaning this  test can be used) for the duration of the COVID-19 declaration under Section 564(b)(1) of the Act, 21 U.S.C. section 360bbb-3(b)(1), unless the authorization is terminated or revoked.  Performed at [facility], [address], [place], [place] [address]       Radiology Studies: DG Chest 2 View  Result Date: 05/24/2021 CLINICAL DATA:  Chest pain. EXAM: CHEST - 2 VIEW COMPARISON:  None. FINDINGS: The heart size and mediastinal contours are within normal limits. Both lungs are clear. The visualized skeletal structures are unremarkable. IMPRESSION: No active cardiopulmonary disease. Electronically Signed   By: Obie Dredge M.D.   On: 05/24/2021 14:25   ECHOCARDIOGRAM COMPLETE  Result Date: 05/25/2021    ECHOCARDIOGRAM REPORT   Patient Name:   Edward Serrano Date of Exam: 05/25/2021 Medical Rec #:  [id number]      Height:       72.0 in Accession #:    [id number]     Weight:       231.6 lb Date of Birth:  1966/06/27       BSA:           2.268 m Patient Age:    55 years       BP:           132/89 mmHg Patient Gender: M              HR:           79 bpm. Exam Location:  Inpatient Procedure: 2D Echo, Cardiac Doppler, Color Doppler and Intracardiac            Opacification Agent Indications:    Elevated Troponin  History:        Patient has no prior history of Echocardiogram examinations.                 Risk Factors:Current Smoker.  Sonographer:    Eulah Pont RDCS Referring Phys: [id number] Angie Fava  Sonographer Comments: Technically difficult study due to poor echo windows. IMPRESSIONS  1. Left ventricular ejection fraction, by estimation, is 50 to 55%. The left ventricle has low normal function. The left ventricle has no regional wall motion abnormalities. Left ventricular diastolic parameters were normal.  2. Right ventricular systolic function is normal. The right ventricular size is normal. Tricuspid regurgitation signal is inadequate for assessing PA pressure.  3. The mitral valve is grossly normal. No evidence of mitral valve regurgitation. No evidence of mitral stenosis.  4. The aortic valve was not well visualized but appears grossly normal. Aortic valve regurgitation is not visualized. No aortic stenosis is present.  5. The inferior vena cava is normal in size with greater than 50% respiratory variability, suggesting right atrial pressure of 3 mmHg. FINDINGS  Left Ventricle: Left ventricular ejection fraction, by estimation, is 50 to 55%. The left ventricle has low normal function. The left ventricle has no regional wall motion abnormalities. Definity contrast agent was given IV to delineate the left ventricular endocardial borders. The left ventricular internal cavity size was normal in size. There is no left ventricular hypertrophy. Left ventricular diastolic parameters were normal. Right Ventricle: The right ventricular size is normal. No increase in right ventricular wall thickness. Right ventricular systolic function is  normal. Tricuspid regurgitation signal is inadequate for assessing PA pressure. The tricuspid regurgitant velocity is 1.70 m/s, and with an assumed right atrial pressure of 3 mmHg, the estimated right ventricular systolic pressure is 14.6 mmHg. Left Atrium: Left atrial size was normal  in size. Right Atrium: Right atrial size was normal in size. Pericardium: There is no evidence of pericardial effusion. Presence of pericardial fat pad. Mitral Valve: The mitral valve is grossly normal. No evidence of mitral valve regurgitation. No evidence of mitral valve stenosis. Tricuspid Valve: The tricuspid valve is normal in structure. Tricuspid valve regurgitation is not demonstrated. No evidence of tricuspid stenosis. Aortic Valve: The aortic valve was not well visualized. Aortic valve regurgitation is not visualized. No aortic stenosis is present. Pulmonic Valve: The pulmonic valve was not well visualized. Pulmonic valve regurgitation is not visualized. No evidence of pulmonic stenosis. Aorta: The aortic root is normal in size and structure and the ascending aorta was not well visualized. Venous: The inferior vena cava is normal in size with greater than 50% respiratory variability, suggesting right atrial pressure of 3 mmHg. IAS/Shunts: The interatrial septum appears to be lipomatous. No atrial level shunt detected by color flow Doppler.  LEFT VENTRICLE PLAX 2D LVIDd:         4.40 cm  Diastology LVIDs:         3.50 cm  LV e' medial:    10.20 cm/s LV PW:         2.15 cm  LV E/e' medial:  6.1 LV IVS:        1.00 cm  LV e' lateral:   11.70 cm/s LVOT diam:     1.90 cm  LV E/e' lateral: 5.3 LV SV:         49 LV SV Index:   22 LVOT Area:     2.84 cm  RIGHT VENTRICLE RV S prime:     13.10 cm/s TAPSE (M-mode): 1.9 cm LEFT ATRIUM             Index       RIGHT ATRIUM           Index LA diam:        2.80 cm 1.23 cm/m  RA Area:     14.20 cm LA Vol (A2C):   44.0 ml 19.40 ml/m RA Volume:   34.10 ml  15.04 ml/m LA Vol (A4C):   42.9 ml  18.92 ml/m LA Biplane Vol: 44.0 ml 19.40 ml/m  AORTIC VALVE LVOT Vmax:   89.30 cm/s LVOT Vmean:  60.300 cm/s LVOT VTI:    0.173 m  AORTA Ao Root diam: 3.00 cm Ao Asc diam:  3.40 cm MITRAL VALVE               TRICUSPID VALVE MV Area (PHT): 3.23 cm    TR Peak grad:   11.6 mmHg MV Decel Time: 235 msec    TR Vmax:        170.00 cm/s MV E velocity: 62.50 cm/s MV A velocity: 50.70 cm/s  SHUNTS MV E/A ratio:  1.23        Systemic VTI:  0.17 m                            Systemic Diam: 1.90 cm Weston Brass MD Electronically signed by Weston Brass MD Signature Date/Time: 05/25/2021/11:35:17 AM    Final        LOS: 0 days   Osvaldo Shipper  [facility] Pager on [email or web address]  05/25/2021, 11:51 AM

## 2021-05-26 ENCOUNTER — Observation Stay (HOSPITAL_COMMUNITY): Payer: No Typology Code available for payment source

## 2021-05-26 ENCOUNTER — Encounter (HOSPITAL_COMMUNITY): Payer: Self-pay | Admitting: Interventional Cardiology

## 2021-05-26 DIAGNOSIS — R072 Precordial pain: Secondary | ICD-10-CM

## 2021-05-26 DIAGNOSIS — I214 Non-ST elevation (NSTEMI) myocardial infarction: Secondary | ICD-10-CM | POA: Diagnosis not present

## 2021-05-26 LAB — BASIC METABOLIC PANEL
Anion gap: 6 (ref 5–15)
BUN: 9 mg/dL (ref 6–20)
CO2: 26 mmol/L (ref 22–32)
Calcium: 9.1 mg/dL (ref 8.9–10.3)
Chloride: 106 mmol/L (ref 98–111)
Creatinine, Ser: 1.12 mg/dL (ref 0.61–1.24)
GFR, Estimated: >60 mL/min (ref >60)
Glucose, Bld: 75 mg/dL (ref 70–99)
Potassium: 4 mmol/L (ref 3.5–5.1)
Sodium: 138 mmol/L (ref 135–145)

## 2021-05-26 LAB — CBC
HCT: 47.4 % (ref 39.0–52.0)
Hemoglobin: 16.5 g/dL (ref 13.0–17.0)
MCH: 31.8 pg (ref 26.0–34.0)
MCHC: 34.8 g/dL (ref 30.0–36.0)
MCV: 91.3 fL (ref 80.0–100.0)
Platelets: 199 10*3/uL (ref 150–400)
RBC: 5.19 MIL/uL (ref 4.22–5.81)
RDW: 12.9 % (ref 11.5–15.5)
WBC: 6.7 10*3/uL (ref 4.0–10.5)
nRBC: 0 % (ref 0.0–0.2)

## 2021-05-26 LAB — HEMOGLOBIN A1C
Hgb A1c MFr Bld: 5 % (ref 4.8–5.6)
Mean Plasma Glucose: 96.8 mg/dL

## 2021-05-26 LAB — HIGH SENSITIVITY CRP: CRP, High Sensitivity: 5.4 mg/L — ABNORMAL HIGH (ref 0.00–3.00)

## 2021-05-26 MED ORDER — METOPROLOL TARTRATE 25 MG PO TABS: 12.5000 mg | ORAL_TABLET | Freq: Two times a day (BID) | ORAL | 3 refills | Status: DC

## 2021-05-26 MED ORDER — NITROGLYCERIN 0.4 MG SL SUBL: 0.4000 mg | SUBLINGUAL_TABLET | SUBLINGUAL | 0 refills | Status: AC | PRN

## 2021-05-26 MED ORDER — ATORVASTATIN CALCIUM 80 MG PO TABS: 80.0000 mg | ORAL_TABLET | Freq: Every day | ORAL | 3 refills | Status: DC

## 2021-05-26 MED ORDER — CLOPIDOGREL BISULFATE 75 MG PO TABS: 75.0000 mg | ORAL_TABLET | Freq: Every day | ORAL | 3 refills | Status: AC

## 2021-05-26 MED ORDER — GADOBUTROL 1 MMOL/ML IV SOLN
10.0000 mL | Freq: Once | INTRAVENOUS | Status: AC | PRN
  Administered 2021-05-26: 10 mL via INTRAVENOUS

## 2021-05-26 MED ORDER — ASPIRIN 81 MG PO TBEC: 81.0000 mg | DELAYED_RELEASE_TABLET | Freq: Every day | ORAL | 3 refills | Status: AC

## 2021-05-26 MED ORDER — LOSARTAN POTASSIUM 25 MG PO TABS: 25.0000 mg | ORAL_TABLET | Freq: Every day | ORAL | 3 refills | Status: DC

## 2021-05-26 MED ORDER — SILDENAFIL CITRATE 50 MG PO TABS: 25.0000 mg | ORAL_TABLET | Freq: Every day | ORAL | 0 refills | Status: AC | PRN

## 2021-05-26 NOTE — Discharge Summary (Signed)
Physician Discharge Summary  Edward Serrano QAS:[id number] DOB: 1966/06/21 DOA: 05/24/2021  PCP: Edward Shan, MD  Admit date: 05/24/2021 Discharge date: 05/26/2021  Time spent: 40 minutes  Recommendations for Outpatient Follow-up:  Follow outpatient CBC/CMP Follow tolerance of new meds - metoprolol, losartan, aspirin/plavix, lipitor Needs follow up with cardiology He's on sildenafil - given precautions since also given prescription for nitro   Discharge Diagnoses:  Principal Problem:   NSTEMI (non-ST elevated myocardial infarction) [facility]) Active Problems:   Chest pain   Polycythemia   Elevated troponin   Tobacco abuse   GAD (generalized anxiety disorder)   Discharge Condition: stable  Diet recommendation: heart healthy  Filed Weights   05/24/21 2115 05/25/21 0450 05/26/21 0519  Weight: 105.1 kg 105.1 kg 102.5 kg    History of present illness:  55 year old male who with no significant past medical history except for tobacco abuse presented with chest pain.   He's s/p LHC with small caliber diagonal vessel obstructive disease.  Cardiac MRI without findings c/w myocarditis.  Treating for NSTEMI.  Discharged with ASA/plavix, lipitor, metoprolol, and arb.  See below for additional details  Hospital Course:  Chest pain/NSTEMI Patient with family history of heart disease.  He smokes to 1-1/2 packs of cigarettes on Edward Serrano daily basis.  However does not have any other chronic medical problems such as hypertension or diabetes. Had elevated troponin levels -> 975.   S/p cath with obstructive disease in small caliber diagonal vessel (cardiac MRI obtained to r/o myocarditis) Cardiac MRI not c/w myocarditis  Echo with EF 50-55%, no RWMA (see report) Cardiology recommending ASA/plavix/BB/ace/statin, nitro prn.  Discharged on aspirin, plavix, metoprolol, losartan, lipitor, nitro prn.  LDL 85, A1c 5   Tobacco abuse Patient was counseled.  Does not appear to be willing to stop smoking at  this time.   Follow outpatient   Obesity Body mass index is 30.65 kg/m.   Procedures: Echo IMPRESSIONS     1. Left ventricular ejection fraction, by estimation, is 50 to 55%. The  left ventricle has low normal function. The left ventricle has no regional  wall motion abnormalities. Left ventricular diastolic parameters were  normal.   2. Right ventricular systolic function is normal. The right ventricular  size is normal. Tricuspid regurgitation signal is inadequate for assessing  PA pressure.   3. The mitral valve is grossly normal. No evidence of mitral valve  regurgitation. No evidence of mitral stenosis.   4. The aortic valve was not well visualized but appears grossly normal.  Aortic valve regurgitation is not visualized. No aortic stenosis is  present.   5. The inferior vena cava is normal in size with greater than 50%  respiratory variability, suggesting right atrial pressure of 3 mmHg.   LHC   1st Diag lesion is 80% stenosed.   2nd Diag lesion is 25% stenosed.   The left ventricular systolic function is normal.   LV end diastolic pressure is normal.   The left ventricular ejection fraction is 50-55% by visual estimate.   There is no aortic valve stenosis.   Only obstructive disease is in Edward Serrano small caliber diagonal vessel.    Unclear whether elevated troponin is from CAD or from myocarditis.  D/w Dr. Gasper Serrano.  Will check cardiac MRI.  If this was an ischemic event in the diagonal territory, would continue DAPT with Plavix for medical therapy.    Consultations: cards  Discharge Exam: Vitals:   05/26/21 0943 05/26/21 1157  BP: 120/84 (!) 142/98  Pulse: 81 89  Resp:  18  Temp:  98.5 F (36.9 C)  SpO2:     No complaints  General: No acute distress. Cardiovascular: Heart sounds show Edward Serrano regular rate, and rhythm. No gallops or rubs. No murmurs. No JVD. Lungs: Clear to auscultation bilaterally with good air movement. No rales, rhonchi or wheezes. Abdomen:  Soft, nontender, nondistended with normal active bowel sounds. No masses. No hepatosplenomegaly. Neurological: Alert and oriented 3. Moves all extremities 4 with equal strength. Cranial nerves II through XII grossly intact. Skin: Warm and dry. No rashes or lesions. Extremities: No clubbing or cyanosis. No edema.   Discharge Instructions   Discharge Instructions     Call MD for:  difficulty breathing, headache or visual disturbances   Complete by: As directed    Call MD for:  extreme fatigue   Complete by: As directed    Call MD for:  hives   Complete by: As directed    Call MD for:  persistant dizziness or light-headedness   Complete by: As directed    Call MD for:  persistant nausea and vomiting   Complete by: As directed    Call MD for:  redness, tenderness, or signs of infection (pain, swelling, redness, odor or green/yellow discharge around incision site)   Complete by: As directed    Call MD for:  severe uncontrolled pain   Complete by: As directed    Call MD for:  temperature >100.4   Complete by: As directed    Diet - low sodium heart healthy   Complete by: As directed    Discharge instructions   Complete by: As directed    You were seen for Edward Serrano heart attack.  You had Edward Serrano catheterization which showed obstructive disease in Edward Serrano small caliber diagonal vessel.    You've been started on aspirin, plavix, and lipitor to treat your heart attack.  You've also been started on metoprolol and losartan for blood pressure control.  You have nitroglycerin to use for chest pain as needed.  Don't take this within 24 hrs of taking sildenafil.    Return for new, recurrent, or worsening symptoms.  Please follow up with cardiology and your PCP as scheduled.  Please ask your PCP to request records from this hospitalization so they know what was done and what the next steps will be.   Increase activity slowly   Complete by: As directed       Allergies as of 05/26/2021       Reactions    Shellfish Allergy Swelling        Medication List     TAKE these medications    acetaminophen 500 MG tablet Commonly known as: TYLENOL Take 1,000 mg by mouth every 6 (six) hours as needed for mild pain or headache.   albuterol 108 (90 Base) MCG/ACT inhaler Commonly known as: VENTOLIN HFA Inhale 2 puffs into the lungs every 4 (four) hours as needed for wheezing or shortness of breath.   aspirin 81 MG EC tablet Take 1 tablet (81 mg total) by mouth daily. Swallow whole. Start taking on: May 27, 2021   atorvastatin 80 MG tablet Commonly known as: LIPITOR Take 1 tablet (80 mg total) by mouth daily. Start taking on: May 27, 2021   clopidogrel 75 MG tablet Commonly known as: PLAVIX Take 1 tablet (75 mg total) by mouth daily with breakfast. Start taking on: May 27, 2021   diazepam 5 MG tablet Commonly known as: VALIUM Take 10 mg  by mouth every 8 (eight) hours as needed for anxiety.   losartan 25 MG tablet Commonly known as: Cozaar Take 1 tablet (25 mg total) by mouth daily.   metoprolol tartrate 25 MG tablet Commonly known as: LOPRESSOR Take 0.5 tablets (12.5 mg total) by mouth 2 (two) times daily.   nitroGLYCERIN 0.4 MG SL tablet Commonly known as: NITROSTAT Place 1 tablet (0.4 mg total) under the tongue every 5 (five) minutes x 3 doses as needed for chest pain.   sildenafil 50 MG tablet Commonly known as: Viagra Take 0.5 tablets (25 mg total) by mouth daily as needed for erectile dysfunction.       Allergies  Allergen Reactions   Shellfish Allergy Swelling    Follow-up Information     Charlie Pitter, PA-C Follow up on 06/10/2021.   Specialties: Cardiology, Radiology Why: Please arrvie 15 minutes early for your 3:15pm post-hospital cardiology appointment Contact information: [address] [place] [address] [phone]                  The results of significant diagnostics from this hospitalization (including  imaging, microbiology, ancillary and laboratory) are listed below for reference.    Significant Diagnostic Studies: DG Chest 2 View  Result Date: 05/24/2021 CLINICAL DATA:  Chest pain. EXAM: CHEST - 2 VIEW COMPARISON:  None. FINDINGS: The heart size and mediastinal contours are within normal limits. Both lungs are clear. The visualized skeletal structures are unremarkable. IMPRESSION: No active cardiopulmonary disease. Electronically Signed   By: Titus Dubin M.D.   On: 05/24/2021 14:25   CARDIAC CATHETERIZATION  Result Date: 05/26/2021   1st Diag lesion is 80% stenosed.   2nd Diag lesion is 25% stenosed.   The left ventricular systolic function is normal.   LV end diastolic pressure is normal.   The left ventricular ejection fraction is 50-55% by visual estimate.   There is no aortic valve stenosis. Only obstructive disease is in Kawena Lyday small caliber diagonal vessel. Unclear whether elevated troponin is from CAD or from myocarditis.  D/w Dr. Gasper Serrano.  Will check cardiac MRI.  If this was an ischemic event in the diagonal territory, would continue DAPT with Plavix for medical therapy.   MR CARDIAC MORPHOLOGY W WO CONTRAST  Result Date: 05/26/2021 CLINICAL DATA:  Clinical questions of myocarditis and microvascular disease Study assumes HCT of 47 and BSA of 2.28 EXAM: CARDIAC MRI TECHNIQUE: The patient was scanned on Daria Mcmeekin 1.5 Tesla GE magnet. Annayah Worthley dedicated cardiac coil was used. Functional imaging was done using Fiesta sequences. 2,3, and 4 chamber views were done to assess for RWMA's. Modified Simpson's rule using Sheliah Fiorillo short axis stack was used to calculate an ejection fraction on Al Gagen dedicated work Conservation officer, nature. The patient received 10 cc of Gadavist. After 10 minutes inversion recovery sequences were used to assess for infiltration and scar tissue. CONTRAST:  10 cc  of Gadavist FINDINGS: 1. Normal left ventricular size, with LVEDD 47 mm, and LVEDVi 61 mL/m2. Normal left ventricular  thickness, with intraventricular septal thickness of 10 mm, posterior wall thickness of 7 mm. Myocardial mass index normal at 42 g/m2. Mildly decreased left ventricular systolic function (LVEF =34%). There are no regional wall motion abnormalities. Left ventricular parametric mapping notable for normal ECV signal and normal T2 signal. There is no significant evidence of late gadolinium enhancement in the left ventricular myocardium: In the mid anteroseptal distribution, this is Aydenn Gervin small epicardial area 5 standard deviations above normal  without concurrent increase in T1 signal. This is less consistent with true late gadolinium enhancement. 2. Small right ventricular size with RVEDVI 57 mL/m2. Normal right ventricular thickness. Normal right ventricular systolic function (RVEF =99%). There are no regional wall motion abnormalities or aneurysms. 3.  Normal left and right atrial size. 4. Normal size of the aortic root, ascending aorta and pulmonary artery. 5.  No significant valvular abnormalities. Tri-leaflet aortic valve. 6. Normal pericardium. No pericardial effusion. Prominent pericardial fat pad. 7. Grossly, no extracardiac findings. Recommended dedicated study if concerned for non-cardiac pathology. 8. Breathhold artifact most notable during LGE assessment, which limits accuracy of assessment. IMPRESSION: Mild decrease in LV function (low normal). Kinney Criteria for myocarditis not met. Rudean Haskell MD Electronically Signed   By: Rudean Haskell M.D.   On: 05/26/2021 12:27   ECHOCARDIOGRAM COMPLETE  Result Date: 05/25/2021    ECHOCARDIOGRAM REPORT   Patient Name:   DARAN FAVARO Date of Exam: 05/25/2021 Medical Rec #:  [id number]      Height:       72.0 in Accession #:    [id number]     Weight:       231.6 lb Date of Birth:  05-Apr-1966       BSA:          2.268 m Patient Age:    9 years       BP:           132/89 mmHg Patient Gender: M              HR:           79 bpm. Exam  Location:  Inpatient Procedure: 2D Echo, Cardiac Doppler, Color Doppler and Intracardiac            Opacification Agent Indications:    Elevated Troponin  History:        Patient has no prior history of Echocardiogram examinations.                 Risk Factors:Current Smoker.  Sonographer:    Bernadene Person RDCS Referring Phys: [id number] Rhetta Mura  Sonographer Comments: Technically difficult study due to poor echo windows. IMPRESSIONS  1. Left ventricular ejection fraction, by estimation, is 50 to 55%. The left ventricle has low normal function. The left ventricle has no regional wall motion abnormalities. Left ventricular diastolic parameters were normal.  2. Right ventricular systolic function is normal. The right ventricular size is normal. Tricuspid regurgitation signal is inadequate for assessing PA pressure.  3. The mitral valve is grossly normal. No evidence of mitral valve regurgitation. No evidence of mitral stenosis.  4. The aortic valve was not well visualized but appears grossly normal. Aortic valve regurgitation is not visualized. No aortic stenosis is present.  5. The inferior vena cava is normal in size with greater than 50% respiratory variability, suggesting right atrial pressure of 3 mmHg. FINDINGS  Left Ventricle: Left ventricular ejection fraction, by estimation, is 50 to 55%. The left ventricle has low normal function. The left ventricle has no regional wall motion abnormalities. Definity contrast agent was given IV to delineate the left ventricular endocardial borders. The left ventricular internal cavity size was normal in size. There is no left ventricular hypertrophy. Left ventricular diastolic parameters were normal. Right Ventricle: The right ventricular size is normal. No increase in right ventricular wall thickness. Right ventricular systolic function is normal. Tricuspid regurgitation signal is inadequate for assessing PA pressure. The tricuspid regurgitant  velocity is 1.70 m/s,  and with an assumed right atrial pressure of 3 mmHg, the estimated right ventricular systolic pressure is 02.1 mmHg. Left Atrium: Left atrial size was normal in size. Right Atrium: Right atrial size was normal in size. Pericardium: There is no evidence of pericardial effusion. Presence of pericardial fat pad. Mitral Valve: The mitral valve is grossly normal. No evidence of mitral valve regurgitation. No evidence of mitral valve stenosis. Tricuspid Valve: The tricuspid valve is normal in structure. Tricuspid valve regurgitation is not demonstrated. No evidence of tricuspid stenosis. Aortic Valve: The aortic valve was not well visualized. Aortic valve regurgitation is not visualized. No aortic stenosis is present. Pulmonic Valve: The pulmonic valve was not well visualized. Pulmonic valve regurgitation is not visualized. No evidence of pulmonic stenosis. Aorta: The aortic root is normal in size and structure and the ascending aorta was not well visualized. Venous: The inferior vena cava is normal in size with greater than 50% respiratory variability, suggesting right atrial pressure of 3 mmHg. IAS/Shunts: The interatrial septum appears to be lipomatous. No atrial level shunt detected by color flow Doppler.  LEFT VENTRICLE PLAX 2D LVIDd:         4.40 cm  Diastology LVIDs:         3.50 cm  LV e' medial:    10.20 cm/s LV PW:         2.15 cm  LV E/e' medial:  6.1 LV IVS:        1.00 cm  LV e' lateral:   11.70 cm/s LVOT diam:     1.90 cm  LV E/e' lateral: 5.3 LV SV:         49 LV SV Index:   22 LVOT Area:     2.84 cm  RIGHT VENTRICLE RV S prime:     13.10 cm/s TAPSE (M-mode): 1.9 cm LEFT ATRIUM             Index       RIGHT ATRIUM           Index LA diam:        2.80 cm 1.23 cm/m  RA Area:     14.20 cm LA Vol (A2C):   44.0 ml 19.40 ml/m RA Volume:   34.10 ml  15.04 ml/m LA Vol (A4C):   42.9 ml 18.92 ml/m LA Biplane Vol: 44.0 ml 19.40 ml/m  AORTIC VALVE LVOT Vmax:   89.30 cm/s LVOT Vmean:  60.300 cm/s LVOT VTI:     0.173 m  AORTA Ao Root diam: 3.00 cm Ao Asc diam:  3.40 cm MITRAL VALVE               TRICUSPID VALVE MV Area (PHT): 3.23 cm    TR Peak grad:   11.6 mmHg MV Decel Time: 235 msec    TR Vmax:        170.00 cm/s MV E velocity: 62.50 cm/s MV Nianna Igo velocity: 50.70 cm/s  SHUNTS MV E/Kirstine Jacquin ratio:  1.23        Systemic VTI:  0.17 m                            Systemic Diam: 1.90 cm Cherlynn Kaiser MD Electronically signed by Cherlynn Kaiser MD Signature Date/Time: 05/25/2021/11:35:17 AM    Final     Microbiology: Recent Results (from the past 240 hour(s))  Resp Panel by RT-PCR (Flu Deanda Ruddell&B, Covid) Nasopharyngeal Swab  Status: None   Collection Time: 05/24/21  5:35 PM   Specimen: Nasopharyngeal Swab; Nasopharyngeal(NP) swabs in vial transport medium  Result Value Ref Range Status   SARS Coronavirus 2 by RT PCR NEGATIVE NEGATIVE Final    Comment: (NOTE) SARS-CoV-2 target nucleic acids are NOT DETECTED.  The SARS-CoV-2 RNA is generally detectable in upper respiratory specimens during the acute phase of infection. The lowest concentration of SARS-CoV-2 viral copies this assay can detect is 138 copies/mL. Isamu Trammel negative result does not preclude SARS-Cov-2 infection and should not be used as the sole basis for treatment or other patient management decisions. Sebastian Dzik negative result may occur with  improper specimen collection/handling, submission of specimen other than nasopharyngeal swab, presence of viral mutation(s) within the areas targeted by this assay, and inadequate number of viral copies(<138 copies/mL). Floyd Lusignan negative result must be combined with clinical observations, patient history, and epidemiological information. The expected result is Negative.  Fact Sheet for Patients:  EntrepreneurPulse.com.au  Fact Sheet for Healthcare Providers:  IncredibleEmployment.be  This test is no t yet approved or cleared by the [place] FDA and  has been authorized for detection and/or  diagnosis of SARS-CoV-2 by FDA under an Emergency Use Authorization (EUA). This EUA will remain  in effect (meaning this test can be used) for the duration of the COVID-19 declaration under Section 564(b)(1) of the Act, 21 U.S.C.section 360bbb-3(b)(1), unless the authorization is terminated  or revoked sooner.       Influenza Jeson Camacho by PCR NEGATIVE NEGATIVE Final   Influenza B by PCR NEGATIVE NEGATIVE Final    Comment: (NOTE) The Xpert Xpress SARS-CoV-2/FLU/RSV plus assay is intended as an aid in the diagnosis of influenza from Nasopharyngeal swab specimens and should not be used as Lorrie Strauch sole basis for treatment. Nasal washings and aspirates are unacceptable for Xpert Xpress SARS-CoV-2/FLU/RSV testing.  Fact Sheet for Patients: EntrepreneurPulse.com.au  Fact Sheet for Healthcare Providers: IncredibleEmployment.be  This test is not yet approved or cleared by the [place] FDA and has been authorized for detection and/or diagnosis of SARS-CoV-2 by FDA under an Emergency Use Authorization (EUA). This EUA will remain in effect (meaning this test can be used) for the duration of the COVID-19 declaration under Section 564(b)(1) of the Act, 21 U.S.C. section 360bbb-3(b)(1), unless the authorization is terminated or revoked.  Performed at [facility], [address] [place], [place] [address]      Labs: Basic Metabolic Panel: Recent Labs  Lab 05/24/21 1411 05/25/21 0000 05/26/21 0157  NA 136 137 138  K 4.0 3.6 4.0  CL 100 104 106  CO2 _0 GLUCOSE 102* 111* 75  BUN _1 CREATININE 1.16 1.20 1.12  CALCIUM 9.0 8.9 9.1  MG  --  2.2  --    Liver Function Tests: Recent Labs  Lab 05/25/21 0000  AST 24  ALT 27  ALKPHOS 78  BILITOT 0.8  PROT 6.1*  ALBUMIN 3.5   No results for input(s): LIPASE, AMYLASE in the last 168 hours. No results for input(s): AMMONIA in the last 168 hours. CBC: Recent Labs  Lab  05/24/21 1411 05/25/21 0000 05/26/21 0157  WBC 8.2 7.0 6.7  HGB 17.5* 16.6 16.5  HCT 50.4 46.5 47.4  MCV 91.1 91.0 91.3  PLT 216 210 199   Cardiac Enzymes: No results for input(s): CKTOTAL, CKMB, CKMBINDEX, TROPONINI in the last 168 hours. BNP: BNP (last 3 results) No results for input(s): BNP in the last 8760  hours.  ProBNP (last 3 results) No results for input(s): PROBNP in the last 8760 hours.  CBG: No results for input(s): GLUCAP in the last 168 hours.     Signed:  Fayrene Helper MD.  [facility] 05/26/2021, 2:09 PM

## 2021-05-26 NOTE — Progress Notes (Signed)
Progress Note  Patient Name: Edward Serrano Date of Encounter: 05/26/2021  Primary Cardiologist: Christell Constant, MD   Subjective   No events overnight.  Had CMR this AM.  No further CP. Normal Sed rate slight elevation to CRP.  Inpatient Medications    Scheduled Meds:  aspirin EC  81 mg Oral Daily   atorvastatin  80 mg Oral Daily   clopidogrel  75 mg Oral Q breakfast   metoprolol tartrate  12.5 mg Oral BID   sodium chloride flush  3 mL Intravenous Q12H   Continuous Infusions:  sodium chloride     PRN Meds: sodium chloride, acetaminophen, diazepam, nitroGLYCERIN, ondansetron (ZOFRAN) IV, sodium chloride flush   Vital Signs    Vitals:   05/26/21 0519 05/26/21 0843 05/26/21 0943 05/26/21 1157  BP: (!) 148/88 (!) 131/98 120/84 (!) 142/98  Pulse: 80 73 81 89  Resp: (!) 21 20  18   Temp: 98.9 F (37.2 C) 98.5 F (36.9 C)  98.5 F (36.9 C)  TempSrc: Oral Oral  Oral  SpO2: 96%     Weight: 102.5 kg     Height:        Intake/Output Summary (Last 24 hours) at 05/26/2021 1228 Last data filed at 05/25/2021 2100 Gross per 24 hour  Intake 1724.78 ml  Output --  Net 1724.78 ml   Filed Weights   05/24/21 2115 05/25/21 0450 05/26/21 0519  Weight: 105.1 kg 105.1 kg 102.5 kg    Telemetry    SR - Personally Reviewed  ECG    SR rate 81 WNL - Personally Reviewed  Physical Exam   GEN: No acute distress.   Neck: No JVD Cardiac: RRR, no murmurs, rubs, or gallops.  Respiratory: Clear to auscultation bilaterally. GI: Soft, nontender, non-distended  MS: No edema; No deformity. Neuro:  Nonfocal  Psych: Normal affect   Labs    Chemistry Recent Labs  Lab 05/24/21 1411 05/25/21 0000 05/26/21 0157  NA 136 137 138  K 4.0 3.6 4.0  CL 100 104 106  CO2 27 25 26   GLUCOSE 102* 111* 75  BUN 10 9 9   CREATININE 1.16 1.20 1.12  CALCIUM 9.0 8.9 9.1  PROT  --  6.1*  --   ALBUMIN  --  3.5  --   AST  --  24  --   ALT  --  27  --   ALKPHOS  --  78  --   BILITOT   --  0.8  --   GFRNONAA >60 >60 >60  ANIONGAP 9 8 6      Hematology Recent Labs  Lab 05/24/21 1411 05/25/21 0000 05/26/21 0157  WBC 8.2 7.0 6.7  RBC 5.53 5.11 5.19  HGB 17.5* 16.6 16.5  HCT 50.4 46.5 47.4  MCV 91.1 91.0 91.3  MCH 31.6 32.5 31.8  MCHC 34.7 35.7 34.8  RDW 13.2 13.1 12.9  PLT 216 210 199    Cardiac EnzymesNo results for input(s): TROPONINI in the last 168 hours. No results for input(s): TROPIPOC in the last 168 hours.   BNPNo results for input(s): BNP, PROBNP in the last 168 hours.   DDimer No results for input(s): DDIMER in the last 168 hours.   Radiology    DG Chest 2 View  Result Date: 05/24/2021 CLINICAL DATA:  Chest pain. EXAM: CHEST - 2 VIEW COMPARISON:  None. FINDINGS: The heart size and mediastinal contours are within normal limits. Both lungs are clear. The visualized skeletal structures are unremarkable. IMPRESSION: No  active cardiopulmonary disease. Electronically Signed   By: Obie Dredge M.D.   On: 05/24/2021 14:25   CARDIAC CATHETERIZATION  Result Date: 05/26/2021   1st Diag lesion is 80% stenosed.   2nd Diag lesion is 25% stenosed.   The left ventricular systolic function is normal.   LV end diastolic pressure is normal.   The left ventricular ejection fraction is 50-55% by visual estimate.   There is no aortic valve stenosis. Only obstructive disease is in a small caliber diagonal vessel. Unclear whether elevated troponin is from CAD or from myocarditis.  D/w Dr. Izora Ribas.  Will check cardiac MRI.  If this was an ischemic event in the diagonal territory, would continue DAPT with Plavix for medical therapy.   ECHOCARDIOGRAM COMPLETE  Result Date: 05/25/2021    ECHOCARDIOGRAM REPORT   Patient Name:   Edward Serrano Date of Exam: 05/25/2021 Medical Rec #:  [id number]      Height:       72.0 in Accession #:    [id number]     Weight:       231.6 lb Date of Birth:  04-27-1966       BSA:          2.268 m Patient Age:    55 years       BP:            132/89 mmHg Patient Gender: M              HR:           79 bpm. Exam Location:  Inpatient Procedure: 2D Echo, Cardiac Doppler, Color Doppler and Intracardiac            Opacification Agent Indications:    Elevated Troponin  History:        Patient has no prior history of Echocardiogram examinations.                 Risk Factors:Current Smoker.  Sonographer:    Eulah Pont RDCS Referring Phys: [id number] Angie Fava  Sonographer Comments: Technically difficult study due to poor echo windows. IMPRESSIONS  1. Left ventricular ejection fraction, by estimation, is 50 to 55%. The left ventricle has low normal function. The left ventricle has no regional wall motion abnormalities. Left ventricular diastolic parameters were normal.  2. Right ventricular systolic function is normal. The right ventricular size is normal. Tricuspid regurgitation signal is inadequate for assessing PA pressure.  3. The mitral valve is grossly normal. No evidence of mitral valve regurgitation. No evidence of mitral stenosis.  4. The aortic valve was not well visualized but appears grossly normal. Aortic valve regurgitation is not visualized. No aortic stenosis is present.  5. The inferior vena cava is normal in size with greater than 50% respiratory variability, suggesting right atrial pressure of 3 mmHg. FINDINGS  Left Ventricle: Left ventricular ejection fraction, by estimation, is 50 to 55%. The left ventricle has low normal function. The left ventricle has no regional wall motion abnormalities. Definity contrast agent was given IV to delineate the left ventricular endocardial borders. The left ventricular internal cavity size was normal in size. There is no left ventricular hypertrophy. Left ventricular diastolic parameters were normal. Right Ventricle: The right ventricular size is normal. No increase in right ventricular wall thickness. Right ventricular systolic function is normal. Tricuspid regurgitation signal is inadequate for  assessing PA pressure. The tricuspid regurgitant velocity is 1.70 m/s, and with an assumed right atrial pressure of 3 mmHg, the estimated  right ventricular systolic pressure is 14.6 mmHg. Left Atrium: Left atrial size was normal in size. Right Atrium: Right atrial size was normal in size. Pericardium: There is no evidence of pericardial effusion. Presence of pericardial fat pad. Mitral Valve: The mitral valve is grossly normal. No evidence of mitral valve regurgitation. No evidence of mitral valve stenosis. Tricuspid Valve: The tricuspid valve is normal in structure. Tricuspid valve regurgitation is not demonstrated. No evidence of tricuspid stenosis. Aortic Valve: The aortic valve was not well visualized. Aortic valve regurgitation is not visualized. No aortic stenosis is present. Pulmonic Valve: The pulmonic valve was not well visualized. Pulmonic valve regurgitation is not visualized. No evidence of pulmonic stenosis. Aorta: The aortic root is normal in size and structure and the ascending aorta was not well visualized. Venous: The inferior vena cava is normal in size with greater than 50% respiratory variability, suggesting right atrial pressure of 3 mmHg. IAS/Shunts: The interatrial septum appears to be lipomatous. No atrial level shunt detected by color flow Doppler.  LEFT VENTRICLE PLAX 2D LVIDd:         4.40 cm  Diastology LVIDs:         3.50 cm  LV e' medial:    10.20 cm/s LV PW:         2.15 cm  LV E/e' medial:  6.1 LV IVS:        1.00 cm  LV e' lateral:   11.70 cm/s LVOT diam:     1.90 cm  LV E/e' lateral: 5.3 LV SV:         49 LV SV Index:   22 LVOT Area:     2.84 cm  RIGHT VENTRICLE RV S prime:     13.10 cm/s TAPSE (M-mode): 1.9 cm LEFT ATRIUM             Index       RIGHT ATRIUM           Index LA diam:        2.80 cm 1.23 cm/m  RA Area:     14.20 cm LA Vol (A2C):   44.0 ml 19.40 ml/m RA Volume:   34.10 ml  15.04 ml/m LA Vol (A4C):   42.9 ml 18.92 ml/m LA Biplane Vol: 44.0 ml 19.40 ml/m  AORTIC  VALVE LVOT Vmax:   89.30 cm/s LVOT Vmean:  60.300 cm/s LVOT VTI:    0.173 m  AORTA Ao Root diam: 3.00 cm Ao Asc diam:  3.40 cm MITRAL VALVE               TRICUSPID VALVE MV Area (PHT): 3.23 cm    TR Peak grad:   11.6 mmHg MV Decel Time: 235 msec    TR Vmax:        170.00 cm/s MV E velocity: 62.50 cm/s MV A velocity: 50.70 cm/s  SHUNTS MV E/A ratio:  1.23        Systemic VTI:  0.17 m                            Systemic Diam: 1.90 cm Weston Brass MD Electronically signed by Weston Brass MD Signature Date/Time: 05/25/2021/11:35:17 AM    Final     Cardiac Studies   Transthoracic Echocardiogram: Date: 05/25/21 Results:  1. Left ventricular ejection fraction, by estimation, is 50 to 55%. The  left ventricle has low normal function. The left ventricle has no regional  wall motion  abnormalities. Left ventricular diastolic parameters were  normal.   2. Right ventricular systolic function is normal. The right ventricular  size is normal. Tricuspid regurgitation signal is inadequate for assessing  PA pressure.   3. The mitral valve is grossly normal. No evidence of mitral valve  regurgitation. No evidence of mitral stenosis.   4. The aortic valve was not well visualized but appears grossly normal.  Aortic valve regurgitation is not visualized. No aortic stenosis is  present.   5. The inferior vena cava is normal in size with greater than 50%  respiratory variability, suggesting right atrial pressure of 3 mmHg.   Left/Right Heart Catheterizations: Date: 05/25/21 Results:   1st Diag lesion is 80% stenosed.   2nd Diag lesion is 25% stenosed.   The left ventricular systolic function is normal.   LV end diastolic pressure is normal.   The left ventricular ejection fraction is 50-55% by visual estimate.   There is no aortic valve stenosis.   Only obstructive disease is in a small caliber diagonal vessel.    Patient Profile     55 y.o. male with a history of tobacco abuse HLD, with troponin  elevation and NSTEMI  Assessment & Plan    Coronary Artery Disease; Obstructive HLD - symptomatic  - anatomy: suspect related to small diagonal stenosis - continue ASA 81 mg; Continue plavix for one year - continue atorvastatin, goal LDL < 70 (presently 85) - continue BB (12.5 mg PO BID) - continue nitrates as PRN, if worsening CP will start Imdur 30 mg PO daily PDEi - continue ACEi - discussed cardiac rehab  Will work out outpatient f/u  Time Spent Directly with Patient:   I have spent a total of 35 minutes with the patient reviewing notes, imaging, EKGs, labs and examining the patient as well as establishing an assessment and plan that was discussed personally with the patient.  > 50% of time was spent in direct patient care and family. Reviewing CMR and Cath.   For questions or updates, please contact [facility] Please consult [email or web address] for contact info under Cardiology/STEMI.      Signed, Christell ConstantMahesh A Sarra Rachels, MD  05/26/2021, 12:28 PM

## 2021-05-26 NOTE — Discharge Instructions (Addendum)
PLEASE REMEMBER TO BRING ALL OF YOUR MEDICATIONS TO EACH OF YOUR FOLLOW-UP OFFICE VISITS.  PLEASE ATTEND ALL SCHEDULED FOLLOW-UP APPOINTMENTS.   Activity: Increase activity slowly as tolerated. You may shower, but no soaking baths (or swimming) for 1 week. No driving for 24 hours. No lifting over 5 lbs for 1 week. No sexual activity for 1 week.   You May Return to Work: in 1 week (if applicable)  Wound Care: You may wash cath site gently with soap and water. Keep cath site clean and dry. If you notice pain, swelling, bleeding or pus at your cath site, please call [phone].  PLEASE DO NOT MISS ANY DOSES OF YOUR PLAVIX. Also keep a log of you blood pressures and bring back to your follow up appt. Please call the office with any questions.   Patients taking blood thinners should generally stay away from medicines like ibuprofen, Advil, Motrin, naproxen, and Aleve due to risk of stomach bleeding. You may take Tylenol as directed or talk to your primary doctor about alternatives.  PLEASE ENSURE THAT YOU DO NOT RUN OUT OF YOUR BRILINTA/PLAVIX. This medication is very important to remain on for at least one year. IF you have issues obtaining this medication due to cost please CALL the office 3-5 business days prior to running out in order to prevent missing doses of this medication.

## 2021-05-28 ENCOUNTER — Telehealth: Payer: Self-pay | Admitting: *Deleted

## 2021-05-28 DIAGNOSIS — Z006 Encounter for examination for normal comparison and control in clinical research program: Secondary | ICD-10-CM

## 2021-05-28 NOTE — Telephone Encounter (Signed)
Contacted patient about enrollment in [employer] Clinical Research Trial.  Email copy of consent sent to patient for review with patients wife and doctor.

## 2021-06-07 ENCOUNTER — Encounter: Payer: Self-pay | Admitting: Physician Assistant

## 2021-06-07 NOTE — Progress Notes (Signed)
Cardiology Office Note    Date:  06/10/2021   ID:  Edward Serrano, DOB [birth year], MRN [id number]  PCP:  Delilah Shan, MD  Cardiologist:  Werner Lean, MD  Electrophysiologist:  None   Chief Complaint: f/u CAD  History of Present Illness:   Edward Serrano is a 55 y.o. male with history of OCD, tobacco abuse, HLD, CAD with recent NSTEMI, prior orthopedic surgeries, prior renal insufficiency by 2014 labs who presents for post-hospital follow-up. He was recently admitted 05/2021 with chest pressure/arm discomfort and elevated troponin c/w NSTEMI. Cath showed only obstructive disease in a small caliber diagonal (80%), otherwise 25% D2, EF 50-55%, normal LVEDP, normal LVSF. 2D echo showed normal EF, otherwise no significant findings. Cardiac MR showed EF 52% otherwise no significant findings suggestive of myocarditis. NSTEMI was therefore attributed to CAD.   He presents back for follow-up feeling well without any recurrent anginal symptoms. He has begun cutting down smoking and is motivated towards making other healthy changes including introduction of diet and increasing exercise. 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 He is tolerating all medications without difficulty. We discussed cardiac rehab and he is not currently interested. The research team has reached out to him about a potential trial and he is not interested at this time.    Labwork independently reviewed: 05/2021 A1C 5.0, BMET wnl K 4.0, Cr 1.12, CBC wnl, LDL 85, Tprot 6.1, AST/ALT wnl   Past Medical History:  Diagnosis Date   CAD in native artery    a. NSTEMI 05/2021 - Cath showed only obstructive disease in a small caliber diagonal (80%), otherwise 25% D2, EF 50-55%, normal LVEDP, normal LVSF. 2D echo showed normal EF, otherwise no significant findings.   OCD (obsessive compulsive disorder)    Tobacco abuse     Past Surgical History:  Procedure Laterality Date   ANKLE RECONSTRUCTION      APPENDECTOMY     LEFT HEART CATH AND CORONARY ANGIOGRAPHY N/A 05/25/2021   Procedure: LEFT HEART CATH AND CORONARY ANGIOGRAPHY;  Surgeon: Jettie Booze, MD;  Location: [facility];  Service: Cardiovascular;  Laterality: N/A;    Current Medications: Current Meds  Medication Sig   acetaminophen (TYLENOL) 500 MG tablet Take 1,000 mg by mouth every 6 (six) hours as needed for mild pain or headache.   albuterol (VENTOLIN HFA) 108 (90 Base) MCG/ACT inhaler Inhale 2 puffs into the lungs every 4 (four) hours as needed for wheezing or shortness of breath.   aspirin EC 81 MG EC tablet Take 1 tablet (81 mg total) by mouth daily. Swallow whole.   atorvastatin (LIPITOR) 80 MG tablet Take 1 tablet (80 mg total) by mouth daily.   clopidogrel (PLAVIX) 75 MG tablet Take 1 tablet (75 mg total) by mouth daily with breakfast.   losartan (COZAAR) 25 MG tablet Take 1 tablet (25 mg total) by mouth daily.   metoprolol tartrate (LOPRESSOR) 25 MG tablet Take 0.5 tablets (12.5 mg total) by mouth 2 (two) times daily.   nitroGLYCERIN (NITROSTAT) 0.4 MG SL tablet Place 1 tablet (0.4 mg total) under the tongue every 5 (five) minutes x 3 doses as needed for chest pain.   sildenafil (VIAGRA) 50 MG tablet Take 0.5 tablets (25 mg total) by mouth daily as needed for erectile dysfunction.      Allergies:   Shellfish allergy   Social History   Socioeconomic History   Marital status: Married    Spouse name: Not  on file   Number of children: Not on file   Years of education: Not on file   Highest education level: Not on file  Occupational History   Not on file  Tobacco Use   Smoking status: Every Day    Packs/day: 1.00    Types: Cigarettes   Smokeless tobacco: Never  Substance and Sexual Activity   Alcohol use: Yes    Comment: occasional   Drug use: Yes    Types: Marijuana   Sexual activity: Not on file  Other Topics Concern   Not on file  Social History Narrative   Not on file   Social  Determinants of Health   Financial Resource Strain: Not on file  Food Insecurity: Not on file  Transportation Needs: Not on file  Physical Activity: Not on file  Stress: Not on file  Social Connections: Not on file     Family History:  The patient's family history includes Heart attack in his mother.  ROS:   Please see the history of present illness.  All other systems are reviewed and otherwise negative.    EKGs/Labs/Other Studies Reviewed:    Studies reviewed are outlined and summarized above. Reports included below if pertinent.  Cath 05/25/21   1st Diag lesion is 80% stenosed.   2nd Diag lesion is 25% stenosed.   The left ventricular systolic function is normal.   LV end diastolic pressure is normal.   The left ventricular ejection fraction is 50-55% by visual estimate.   There is no aortic valve stenosis.   Only obstructive disease is in a small caliber diagonal vessel.    Unclear whether elevated troponin is from CAD or from myocarditis.  D/w Dr. [name].  Will check cardiac MRI.  If this was an ischemic event in the diagonal territory, would continue DAPT with Plavix for medical therapy.   2D echo 05/25/21  1. Left ventricular ejection fraction, by estimation, is 50 to 55%. The  left ventricle has low normal function. The left ventricle has no regional  wall motion abnormalities. Left ventricular diastolic parameters were  normal.   2. Right ventricular systolic function is normal. The right ventricular  size is normal. Tricuspid regurgitation signal is inadequate for assessing  PA pressure.   3. The mitral valve is grossly normal. No evidence of mitral valve  regurgitation. No evidence of mitral stenosis.   4. The aortic valve was not well visualized but appears grossly normal.  Aortic valve regurgitation is not visualized. No aortic stenosis is  present.   5. The inferior vena cava is normal in size with greater than 50%  respiratory variability,  suggesting right atrial pressure of 3 mmHg.   Cardiac MR 05/26/21 CLINICAL DATA:  Clinical questions of myocarditis and microvascular disease Study assumes HCT of 47 and BSA of 2.28   EXAM: CARDIAC MRI   TECHNIQUE: The patient was scanned on a 1.5 Tesla GE magnet. A dedicated cardiac coil was used. Functional imaging was done using Fiesta sequences. 2,3, and 4 chamber views were done to assess for RWMA's. Modified Simpson's rule using a short axis stack was used to calculate an ejection fraction on a dedicated work Conservation officer, nature. The patient received 10 cc of Gadavist. After 10 minutes inversion recovery sequences were used to assess for infiltration and scar tissue.   CONTRAST:  10 cc  of Gadavist   FINDINGS: 1. Normal left ventricular size, with LVEDD 47 mm, and LVEDVi 61 mL/m2.   Normal  left ventricular thickness, with intraventricular septal thickness of 10 mm, posterior wall thickness of 7 mm. Myocardial mass index normal at 42 g/m2.   Mildly decreased left ventricular systolic function (LVEF =48%). There are no regional wall motion abnormalities.   Left ventricular parametric mapping notable for normal ECV signal and normal T2 signal.   There is no significant evidence of late gadolinium enhancement in the left ventricular myocardium: In the mid anteroseptal distribution, this is a small epicardial area 5 standard deviations above normal without concurrent increase in T1 signal. This is less consistent with true late gadolinium enhancement.   2. Small right ventricular size with RVEDVI 57 mL/m2.   Normal right ventricular thickness.   Normal right ventricular systolic function (RVEF =54%). There are no regional wall motion abnormalities or aneurysms.   3.  Normal left and right atrial size.   4. Normal size of the aortic root, ascending aorta and pulmonary artery.   5.  No significant valvular abnormalities.   Tri-leaflet aortic valve.    6. Normal pericardium. No pericardial effusion. Prominent pericardial fat pad.   7. Grossly, no extracardiac findings. Recommended dedicated study if concerned for non-cardiac pathology.   8. Breathhold artifact most notable during LGE assessment, which limits accuracy of assessment.   IMPRESSION: Mild decrease in LV function (low normal).   [place] Criteria for myocarditis not met.   Rudean Haskell MD     Electronically Signed   By: Rudean Haskell M.D.   On: 05/26/2021 12:27      EKG:  EKG is ordered today, personally reviewed, demonstrating NSR 78bpm no acute changes  Recent Labs: 05/25/2021: ALT 27; Magnesium 2.2 05/26/2021: BUN 9; Creatinine, Ser 1.12; Hemoglobin 16.5; Platelets 199; Potassium 4.0; Sodium 138  Recent Lipid Panel    Component Value Date/Time   CHOL 130 05/25/2021 0511   TRIG 105 05/25/2021 0511   HDL 24 (L) 05/25/2021 0511   CHOLHDL 5.4 05/25/2021 0511   VLDL 21 05/25/2021 0511   LDLCALC 85 05/25/2021 0511    PHYSICAL EXAM:    VS:  BP 108/70   Pulse 78   Ht 6' (1.829 m)   Wt 228 lb (103.4 kg)   SpO2 97%   BMI 30.92 kg/m   BMI: Body mass index is 30.92 kg/m.  GEN: Well nourished, well developed male in no acute distress HEENT: normocephalic, atraumatic Neck: no JVD, carotid bruits, or masses Cardiac: RRR; no murmurs, rubs, or gallops, no edema  Respiratory:  clear to auscultation bilaterally, normal work of breathing GI: soft, nontender, nondistended, + BS MS: no deformity or atrophy Skin: warm and dry, no rash, right radial cath site without hematoma or ecchymosis; good pulse. Neuro:  Alert and Oriented x 3, Strength and sensation are intact, follows commands Psych: euthymic mood, full affect  Wt Readings from Last 3 Encounters:  06/10/21 228 lb (103.4 kg)  05/26/21 225 lb 15.5 oz (102.5 kg)  06/02/13 231 lb 14.8 oz (105.2 kg)     ASSESSMENT & PLAN:   1. CAD with recent NSTEMI - doing great without  recurrent angina. Continue current regimen to include DAPT with ASA and Plavix, metoprolol and ARB given low-normal systolic function. His BP is normal at this time but I did ask that he periodically follow at home as he works on diet/exercise as sometimes we need to pull back on his regimen if his BP begins dropping. Told him to call if running 627 systolic or less. He declines referral  to cardiac rehab at this time. We also reviewed the drug interaction between SL NTG and sildenafil, and instructions were provided to avoid one if he has taken the other within 48 hours and vice versa. Check BMET today given recent losartan initiation. Ultimate duration of DAPT will be at discretion of primary cardiologist, but anticipate 1 year.  2. Tobacco abuse - gradually working on cutting down. Congratulated for his efforts. He does not yet have a quit date planned.  3. Lipid management with goal LDL <70 - continue statin. Check CMET/lipid profile in 6 weeks.  4. Borderline obesity - he acknowledged a desire to work on lifestyle modifications to address this further. His wife and daughter are doing [employer] and he will be jumping into that program as well. He was asked to let us know if he changes his mind about participation in cardiac rehab.  Disposition: F/u with Dr. [name] in 3 months.   Medication Adjustments/Labs and Tests Ordered: Current medicines are reviewed at length with the patient today.  Concerns regarding medicines are outlined above. Medication changes, Labs and Tests ordered today are summarized above and listed in the Patient Instructions accessible in Encounters.   Signed, Charlie Pitter, PA-C  06/10/2021 3:58 PM    [facility] [address], [place], [place]  [id number] Phone: [phone]; Fax: [phone]

## 2021-06-10 ENCOUNTER — Ambulatory Visit (INDEPENDENT_AMBULATORY_CARE_PROVIDER_SITE_OTHER): Payer: No Typology Code available for payment source | Admitting: Physician Assistant

## 2021-06-10 ENCOUNTER — Encounter: Payer: Self-pay | Admitting: Physician Assistant

## 2021-06-10 ENCOUNTER — Other Ambulatory Visit: Payer: Self-pay

## 2021-06-10 VITALS — BP 108/70 | HR 78 | Ht 72.0 in | Wt 228.0 lb

## 2021-06-10 DIAGNOSIS — Z720 Tobacco use: Secondary | ICD-10-CM

## 2021-06-10 DIAGNOSIS — I2510 Atherosclerotic heart disease of native coronary artery without angina pectoris: Secondary | ICD-10-CM

## 2021-06-10 DIAGNOSIS — Z6830 Body mass index (BMI) 30.0-30.9, adult: Secondary | ICD-10-CM

## 2021-06-10 DIAGNOSIS — I214 Non-ST elevation (NSTEMI) myocardial infarction: Secondary | ICD-10-CM

## 2021-06-10 DIAGNOSIS — Z13220 Encounter for screening for lipoid disorders: Secondary | ICD-10-CM

## 2021-06-10 DIAGNOSIS — E669 Obesity, unspecified: Secondary | ICD-10-CM

## 2021-06-10 NOTE — Patient Instructions (Signed)
Medication Instructions:  Your physician recommends that you continue on your current medications as directed. Please refer to the Current Medication list given to you today.  *If you need a refill on your cardiac medications before your next appointment, please call your pharmacy*   Lab Work: TODAY:  MET  6 WEEKS:  FASTING LIPID & CMET  If you have labs (blood work) drawn today and your tests are completely normal, you will receive your results only by: MyChart Message (if you have MyChart) OR A paper copy in the mail If you have any lab test that is abnormal or we need to change your treatment, we will call you to review the results.   Testing/Procedures: None ordered   Follow-Up: At [facility], you and your health needs are our priority.  As part of our continuing mission to provide you with exceptional heart care, we have created designated Provider Care Teams.  These Care Teams include your primary Cardiologist (physician) and Advanced Practice Providers (APPs -  Physician Assistants and Nurse Practitioners) who all work together to provide you with the care you need, when you need it.  We recommend signing up for the patient portal called "MyChart".  Sign up information is provided on this After Visit Summary.  MyChart is used to connect with patients for Virtual Visits (Telemedicine).  Patients are able to view lab/test results, encounter notes, upcoming appointments, etc.  Non-urgent messages can be sent to your provider as well.   To learn more about what you can do with MyChart, go to [email or web address].    Your next appointment:   3 month(s)  The format for your next appointment:   In Person  Provider:   You may see Mahesh A Chandrasekhar, MD or one of the following Advanced Practice Providers on your designated Care Team:   Dayna Dunn, PA-C Michele Lenze, PA-C   Other Instructions  Do not take nitroglycerin if you have taken sildenafil in the last 48  hours. The opposite is true as well. Do not take sildenafil if you have taken nitroglycerin in the last 48 hours. If you take these two medicines, they can cause low blood pressure if taken close together.   Please monitor your blood pressure periodically at home. If you begin to notice it running 105 or less on the top number please give us a call. 

## 2021-06-11 LAB — BASIC METABOLIC PANEL
BUN/Creatinine Ratio: 7 — ABNORMAL LOW (ref 9–20)
BUN: 8 mg/dL (ref 6–24)
CO2: 26 mmol/L (ref 20–29)
Calcium: 9.7 mg/dL (ref 8.7–10.2)
Chloride: 100 mmol/L (ref 96–106)
Creatinine, Ser: 1.15 mg/dL (ref 0.76–1.27)
Glucose: 99 mg/dL (ref 65–99)
Potassium: 4.6 mmol/L (ref 3.5–5.2)
Sodium: 138 mmol/L (ref 134–144)
eGFR: 75 mL/min/{1.73_m2} (ref >59)

## 2021-06-11 NOTE — Progress Notes (Signed)
Pt has been made aware of normal result and verbalized understanding.  jw

## 2021-07-07 DIAGNOSIS — Z006 Encounter for examination for normal comparison and control in clinical research program: Secondary | ICD-10-CM

## 2021-07-07 NOTE — Research (Signed)
Called patient to discuss Lpa study with no answer. Message left for patient to return my call.

## 2021-07-22 ENCOUNTER — Other Ambulatory Visit: Payer: No Typology Code available for payment source

## 2021-07-28 ENCOUNTER — Other Ambulatory Visit: Payer: No Typology Code available for payment source

## 2021-09-15 ENCOUNTER — Ambulatory Visit: Payer: No Typology Code available for payment source | Admitting: Internal Medicine

## 2021-10-12 ENCOUNTER — Encounter: Payer: Self-pay | Admitting: Internal Medicine

## 2021-10-12 NOTE — Telephone Encounter (Signed)
Pt had recent OV 06/2021 so please find out what meds need refilled

## 2021-10-13 MED ORDER — ATORVASTATIN CALCIUM 80 MG PO TABS: 80.0000 mg | ORAL_TABLET | Freq: Every day | ORAL | 3 refills | Status: DC

## 2021-10-13 MED ORDER — METOPROLOL TARTRATE 25 MG PO TABS: 12.5000 mg | ORAL_TABLET | Freq: Two times a day (BID) | ORAL | 3 refills | Status: DC

## 2021-10-13 MED ORDER — LOSARTAN POTASSIUM 25 MG PO TABS: 25.0000 mg | ORAL_TABLET | Freq: Every day | ORAL | 3 refills | Status: DC

## 2021-10-13 NOTE — Telephone Encounter (Signed)
Refills sent in for atorvastatin 80 mg PO QD, losartan 25 mg PO QD and metoprolol tartrate 12.5 mg PO BID.  #90 with 3 refills.

## 2021-10-22 MED ORDER — CLOPIDOGREL BISULFATE 75 MG PO TABS: 75.0000 mg | ORAL_TABLET | Freq: Every day | ORAL | 2 refills | Status: DC

## 2021-10-22 MED ORDER — ASPIRIN EC 81 MG PO TBEC: 81.0000 mg | DELAYED_RELEASE_TABLET | Freq: Every day | ORAL | 2 refills | Status: AC

## 2021-10-22 NOTE — Addendum Note (Signed)
Addended by: Margaret Pyle D on: 10/22/2021 08:01 AM   Modules accepted: Orders

## 2021-11-02 ENCOUNTER — Encounter: Payer: Self-pay | Admitting: Internal Medicine

## 2021-11-02 ENCOUNTER — Other Ambulatory Visit: Payer: Self-pay

## 2021-11-02 ENCOUNTER — Ambulatory Visit (INDEPENDENT_AMBULATORY_CARE_PROVIDER_SITE_OTHER): Payer: No Typology Code available for payment source | Admitting: Internal Medicine

## 2021-11-02 VITALS — BP 116/71 | HR 85 | Ht 72.0 in | Wt 229.0 lb

## 2021-11-02 DIAGNOSIS — Z720 Tobacco use: Secondary | ICD-10-CM | POA: Diagnosis not present

## 2021-11-02 DIAGNOSIS — I2510 Atherosclerotic heart disease of native coronary artery without angina pectoris: Secondary | ICD-10-CM

## 2021-11-02 DIAGNOSIS — E782 Mixed hyperlipidemia: Secondary | ICD-10-CM | POA: Diagnosis not present

## 2021-11-02 NOTE — Progress Notes (Signed)
°Cardiology Office Note:   ° °Date:  11/02/2021  ° °ID:  Edward Serrano, DOB 06/11/1966, MRN [id number] ° °PCP:  Pcp, No °  °[facility] Providers °Cardiologist:  Mahesh A Chandrasekhar, MD    ° °Referring MD: Kelly, Samuel S, MD  ° °Follow up NSTEMI ° °History of Present Illness:   ° °Edward Serrano is a 56 y.o. male with a hx of prior tobacco abuse, CAD (small caliber diagonal 80%), who presents for follow up after 8/22 NSTEMI admission (with negative CMR). ° °Patient notes that he is doing well since his hospitalization.   °Since last visit has cut down his smoking in half and keeps working to cut down. °Has gone back to this band and his not having symptoms. ° °No chest pain or pressure .  No SOB/DOE and no PND/Orthopnea.  No weight gain or leg swelling.  No palpitations or syncope .  Bleeds when he cuts himself but not spontaneous bleeding issues. ° °Angina was shoulder burning and hasn't happened since.  ° °He is getting over a viral prodrome but feels back to normal. ° ° °Past Medical History:  °Diagnosis Date  ° CAD in native artery   ° a. NSTEMI 05/2021 - Cath showed only obstructive disease in a small caliber diagonal (80%), otherwise 25% D2, EF 50-55%, normal LVEDP, normal LVSF. 2D echo showed normal EF, otherwise no significant findings.  ° OCD (obsessive compulsive disorder)   ° Tobacco abuse   ° ° °Past Surgical History:  °Procedure Laterality Date  ° ANKLE RECONSTRUCTION    ° APPENDECTOMY    ° LEFT HEART CATH AND CORONARY ANGIOGRAPHY N/A 05/25/2021  ° Procedure: LEFT HEART CATH AND CORONARY ANGIOGRAPHY;  Surgeon: Varanasi, Jayadeep S, MD;  Location: [facility];  Service: Cardiovascular;  Laterality: N/A;  ° ° °Current Medications: °Current Meds  °Medication Sig  ° albuterol (VENTOLIN HFA) 108 (90 Base) MCG/ACT inhaler Inhale 2 puffs into the lungs every 4 (four) hours as needed for wheezing or shortness of breath.  ° aspirin EC 81 MG tablet Take 1 tablet (81 mg total) by mouth daily. Swallow  whole.  ° atorvastatin (LIPITOR) 80 MG tablet Take 1 tablet (80 mg total) by mouth daily.  ° benzonatate (TESSALON) 200 MG capsule Take 200 mg by mouth as needed.  ° clopidogrel (PLAVIX) 75 MG tablet Take 1 tablet (75 mg total) by mouth daily.  ° losartan (COZAAR) 25 MG tablet Take 1 tablet (25 mg total) by mouth daily.  ° metoprolol tartrate (LOPRESSOR) 25 MG tablet Take 0.5 tablets (12.5 mg total) by mouth 2 (two) times daily.  ° nitroGLYCERIN (NITROSTAT) 0.4 MG SL tablet Place 1 tablet (0.4 mg total) under the tongue every 5 (five) minutes x 3 doses as needed for chest pain.  ° sildenafil (VIAGRA) 50 MG tablet Take 0.5 tablets (25 mg total) by mouth daily as needed for erectile dysfunction.  °  ° °Allergies:   Shellfish allergy  ° °Social History  ° °Socioeconomic History  ° Marital status: Married  °  Spouse name: Not on file  ° Number of children: Not on file  ° Years of education: Not on file  ° Highest education level: Not on file  °Occupational History  ° Not on file  °Tobacco Use  ° Smoking status: Every Day  °  Packs/day: 1.00  °  Types: Cigarettes  ° Smokeless tobacco: Never  °Substance and Sexual Activity  ° Alcohol use: Yes  °  Comment: occasional  °   Drug use: Yes    Types: Marijuana   Sexual activity: Not on file  Other Topics Concern   Not on file  Social History Narrative   Not on file   Social Determinants of Health   Financial Resource Strain: Not on file  Food Insecurity: Not on file  Transportation Needs: Not on file  Physical Activity: Not on file  Stress: Not on file  Social Connections: Not on file    Social: plays bass in the [employer]  Family History: The patient's family history includes Heart attack in his mother. Doesn't know his dad's side of the family.  ROS:   Please see the history of present illness.     All other systems reviewed and are negative.  EKGs/Labs/Other Studies Reviewed:    The following studies were reviewed today:  EKG:    06/10/21: SR 78 WNL  CMR: Date: 05/26/21 Results: IMPRESSION: Mild decrease in LV function (low normal).   [place] Criteria for myocarditis not met.  Transthoracic Echocardiogram: Date: 05/25/21 Results:  1. Left ventricular ejection fraction, by estimation, is 50 to 55%. The  left ventricle has low normal function. The left ventricle has no regional  wall motion abnormalities. Left ventricular diastolic parameters were  normal.   2. Right ventricular systolic function is normal. The right ventricular  size is normal. Tricuspid regurgitation signal is inadequate for assessing  PA pressure.   3. The mitral valve is grossly normal. No evidence of mitral valve  regurgitation. No evidence of mitral stenosis.   4. The aortic valve was not well visualized but appears grossly normal.  Aortic valve regurgitation is not visualized. No aortic stenosis is  present.   5. The inferior vena cava is normal in size with greater than 50%  respiratory variability, suggesting right atrial pressure of 3 mmHg.    Left/Right Heart Catheterizations: Date: 05/25/21 Results:   1st Diag lesion is 80% stenosed.   2nd Diag lesion is 25% stenosed.   The left ventricular systolic function is normal.   LV end diastolic pressure is normal.   The left ventricular ejection fraction is 50-55% by visual estimate.   There is no aortic valve stenosis.   Only obstructive disease is in a small caliber diagonal vessel    Recent Labs: 05/25/2021: ALT 27; Magnesium 2.2 05/26/2021: Hemoglobin 16.5; Platelets 199 06/10/2021: BUN 8; Creatinine, Ser 1.15; Potassium 4.6; Sodium 138  Recent Lipid Panel    Component Value Date/Time   CHOL 130 05/25/2021 0511   TRIG 105 05/25/2021 0511   HDL 24 (L) 05/25/2021 0511   CHOLHDL 5.4 05/25/2021 0511   VLDL 21 05/25/2021 0511   LDLCALC 85 05/25/2021 0511        Physical Exam:    VS:  BP 116/71    Pulse 85    Ht 6' (1.829 m)    Wt 103.9 kg    SpO2 94%    BMI  31.06 kg/m     Wt Readings from Last 3 Encounters:  11/02/21 103.9 kg  06/10/21 103.4 kg  05/26/21 102.5 kg    Gen: no distress, Neck: No JVD,  Ears: Left Pilar Plate Sign Cardiac: No Rubs or Gallops, no Murmur, regular rate +2 radial pulses Respiratory: Bilateral inspiratory wheezes in bases, normal effort, normal  respiratory rate GI: Soft, nontender, non-distended  MS: No  edema;  moves all extremities Integument: Skin feels warm Neuro:  At time of evaluation, alert and oriented to person/place/time/situation  Psych:  Normal affect, patient feels well ° ° °ASSESSMENT:   ° °1. CAD in native artery   °2. Tobacco abuse   °3. Mixed hyperlipidemia   ° °PLAN:   ° °Coronary Artery Disease; Obstructive °Current  Smoker  °HLD °- asymptomatic on medical therapy °- anatomy: Small D1 disease °- continue ASA 81 mg; Continue plavix until 18/2023 °- continue statin, goal LDL < 70, lipids today and may add zetia 10  °- continue BB °- continue nitrates; we have discussed red flag interaction with his PRN  PDEi °- continue ARB °- discussed smoking cessation (planning stage and actively cutting back) ° °August 2023 f/u °   ° ° °Medication Adjustments/Labs and Tests Ordered: °Current medicines are reviewed at length with the patient today.  Concerns regarding medicines are outlined above.  °Orders Placed This Encounter  °Procedures  ° Lipid panel  ° ALT  ° °No orders of the defined types were placed in this encounter. ° ° °Patient Instructions  °Medication Instructions:  °Your physician recommends that you continue on your current medications as directed. Please refer to the Current Medication list given to you today.  °*If you need a refill on your cardiac medications before your next appointment, please call your pharmacy* ° ° °Lab Work: °TODAY: Lipid Panel and ALT °If you have labs (blood work) drawn today and your tests are completely normal, you will receive your results only by: °MyChart Message (if you have MyChart)  OR °A paper copy in the mail °If you have any lab test that is abnormal or we need to change your treatment, we will call you to review the results. ° ° °Testing/Procedures: °NONE ° ° °Follow-Up: °At [facility], you and your health needs are our priority.  As part of our continuing mission to provide you with exceptional heart care, we have created designated Provider Care Teams.  These Care Teams include your primary Cardiologist (physician) and Advanced Practice Providers (APPs -  Physician Assistants and Nurse Practitioners) who all work together to provide you with the care you need, when you need it. ° ° °Your next appointment:   °7 month(s) ° °The format for your next appointment:   °In Person ° °Provider:   °Mahesh A Chandrasekhar, MD  °  ° °  ° °Signed, °Mahesh A Chandrasekhar, MD  °11/02/2021 2:23 PM    ° [facility] ° °

## 2021-11-02 NOTE — Patient Instructions (Signed)
Medication Instructions:  Your physician recommends that you continue on your current medications as directed. Please refer to the Current Medication list given to you today.  *If you need a refill on your cardiac medications before your next appointment, please call your pharmacy*   Lab Work: TODAY: Lipid Panel and ALT If you have labs (blood work) drawn today and your tests are completely normal, you will receive your results only by: MyChart Message (if you have MyChart) OR A paper copy in the mail If you have any lab test that is abnormal or we need to change your treatment, we will call you to review the results.   Testing/Procedures: NONE   Follow-Up: At [facility], you and your health needs are our priority.  As part of our continuing mission to provide you with exceptional heart care, we have created designated Provider Care Teams.  These Care Teams include your primary Cardiologist (physician) and Advanced Practice Providers (APPs -  Physician Assistants and Nurse Practitioners) who all work together to provide you with the care you need, when you need it.   Your next appointment:   7 month(s)  The format for your next appointment:   In Person  Provider:   Christell Constant, MD

## 2021-11-03 LAB — ALT: ALT: 22 IU/L (ref 0–44)

## 2021-11-03 LAB — LIPID PANEL
Chol/HDL Ratio: 4.3 ratio (ref 0.0–5.0)
Cholesterol, Total: 111 mg/dL (ref 100–199)
HDL: 26 mg/dL — ABNORMAL LOW (ref >39)
LDL Chol Calc (NIH): 41 mg/dL (ref 0–99)
Triglycerides: 285 mg/dL — ABNORMAL HIGH (ref 0–149)
VLDL Cholesterol Cal: 44 mg/dL — ABNORMAL HIGH (ref 5–40)

## 2022-05-08 ENCOUNTER — Other Ambulatory Visit: Payer: Self-pay

## 2022-05-08 ENCOUNTER — Emergency Department (HOSPITAL_BASED_OUTPATIENT_CLINIC_OR_DEPARTMENT_OTHER): Payer: No Typology Code available for payment source

## 2022-05-08 ENCOUNTER — Encounter (HOSPITAL_BASED_OUTPATIENT_CLINIC_OR_DEPARTMENT_OTHER): Payer: Self-pay | Admitting: Emergency Medicine

## 2022-05-08 ENCOUNTER — Emergency Department (HOSPITAL_BASED_OUTPATIENT_CLINIC_OR_DEPARTMENT_OTHER)
Admission: EM | Admit: 2022-05-08 | Discharge: 2022-05-08 | Disposition: A | Discharge status: Home / Self Care | Payer: No Typology Code available for payment source | Attending: Emergency Medicine | Admitting: Emergency Medicine

## 2022-05-08 DIAGNOSIS — Z7982 Long term (current) use of aspirin: Secondary | ICD-10-CM | POA: Insufficient documentation

## 2022-05-08 DIAGNOSIS — I10 Essential (primary) hypertension: Secondary | ICD-10-CM | POA: Insufficient documentation

## 2022-05-08 DIAGNOSIS — Z79899 Other long term (current) drug therapy: Secondary | ICD-10-CM | POA: Diagnosis not present

## 2022-05-08 DIAGNOSIS — R55 Syncope and collapse: Secondary | ICD-10-CM | POA: Diagnosis present

## 2022-05-08 DIAGNOSIS — Z7902 Long term (current) use of antithrombotics/antiplatelets: Secondary | ICD-10-CM | POA: Insufficient documentation

## 2022-05-08 DIAGNOSIS — F17200 Nicotine dependence, unspecified, uncomplicated: Secondary | ICD-10-CM | POA: Diagnosis not present

## 2022-05-08 DIAGNOSIS — R413 Other amnesia: Secondary | ICD-10-CM | POA: Diagnosis not present

## 2022-05-08 DIAGNOSIS — I2510 Atherosclerotic heart disease of native coronary artery without angina pectoris: Secondary | ICD-10-CM | POA: Insufficient documentation

## 2022-05-08 LAB — ETHANOL: Alcohol, Ethyl (B): 17 mg/dL — ABNORMAL HIGH (ref <10)

## 2022-05-08 LAB — RAPID URINE DRUG SCREEN, HOSP PERFORMED
Amphetamines: NOT DETECTED
Barbiturates: NOT DETECTED
Benzodiazepines: NOT DETECTED
Cocaine: NOT DETECTED
Opiates: NOT DETECTED
Tetrahydrocannabinol: POSITIVE — AB

## 2022-05-08 LAB — URINALYSIS, ROUTINE W REFLEX MICROSCOPIC
Bilirubin Urine: NEGATIVE
Glucose, UA: NEGATIVE mg/dL
Ketones, ur: NEGATIVE mg/dL
Leukocytes,Ua: NEGATIVE
Nitrite: NEGATIVE
Protein, ur: NEGATIVE mg/dL
Specific Gravity, Urine: >=1.03 (ref 1.005–1.030)
pH: 5.5 (ref 5.0–8.0)

## 2022-05-08 LAB — PROTIME-INR
INR: 1 (ref 0.8–1.2)
Prothrombin Time: 12.8 seconds (ref 11.4–15.2)

## 2022-05-08 LAB — BASIC METABOLIC PANEL
Anion gap: 6 (ref 5–15)
BUN: 11 mg/dL (ref 6–20)
CO2: 23 mmol/L (ref 22–32)
Calcium: 8.8 mg/dL — ABNORMAL LOW (ref 8.9–10.3)
Chloride: 110 mmol/L (ref 98–111)
Creatinine, Ser: 1.1 mg/dL (ref 0.61–1.24)
GFR, Estimated: >60 mL/min (ref >60)
Glucose, Bld: 91 mg/dL (ref 70–99)
Potassium: 4 mmol/L (ref 3.5–5.1)
Sodium: 139 mmol/L (ref 135–145)

## 2022-05-08 LAB — URINALYSIS, MICROSCOPIC (REFLEX): WBC, UA: NONE SEEN WBC/hpf (ref 0–5)

## 2022-05-08 LAB — CBC
HCT: 48.8 % (ref 39.0–52.0)
Hemoglobin: 17.2 g/dL — ABNORMAL HIGH (ref 13.0–17.0)
MCH: 31.3 pg (ref 26.0–34.0)
MCHC: 35.2 g/dL (ref 30.0–36.0)
MCV: 88.9 fL (ref 80.0–100.0)
Platelets: 211 10*3/uL (ref 150–400)
RBC: 5.49 MIL/uL (ref 4.22–5.81)
RDW: 13 % (ref 11.5–15.5)
WBC: 9.2 10*3/uL (ref 4.0–10.5)
nRBC: 0 % (ref 0.0–0.2)

## 2022-05-08 LAB — TROPONIN I (HIGH SENSITIVITY): Troponin I (High Sensitivity): 4 ng/L (ref <18)

## 2022-05-08 LAB — CBG MONITORING, ED: Glucose-Capillary: 86 mg/dL (ref 70–99)

## 2022-05-08 LAB — APTT: aPTT: 27 seconds (ref 24–36)

## 2022-05-08 MED ORDER — SODIUM CHLORIDE 0.9 % IV BOLUS
500.0000 mL | Freq: Once | INTRAVENOUS | Status: AC
  Administered 2022-05-08: 500 mL via INTRAVENOUS

## 2022-05-08 NOTE — Discharge Instructions (Signed)
Follow-up on your drug test at home.  All of your labs and imaging were normal.  It appears you had more to strengthen your thoughts.  Return with any worsening or concerning symptoms.  Especially any repeat episodes of confusion or loss of consciousness.  It was a pleasure to meet you and I hope that you feel better.

## 2022-05-08 NOTE — ED Triage Notes (Signed)
Pt arrives pov, steady gait, reports loc last night as a passenger in a car after "show" Pt is in band.  Endorses 3 ETOH beverages over several hours. Pt states "I dont remember anything, woke up in my bed."

## 2022-05-08 NOTE — ED Provider Notes (Signed)
[facility] EMERGENCY DEPARTMENT Provider Note   CSN: [id number] Arrival date & time: 05/08/22  1510     History  Chief Complaint  Patient presents with   Loss of Consciousness    Edward Serrano is a 56 y.o. male with a past medical history of hypertension, hyperlipidemia, CAD, tobacco use and recent coronary artery stenting in August 2022 presenting today with a concern for a "blackout."  Patient plays in a [place]  He reports having 3 alcoholic drinks over 4 hours.  He remembers packing up and loading up his car after the stroke and that is the last thing he remembers.  He was on the phone with some friends and told them that he could not see and that one of them needed to drive the car.  They reportedly took him home and put him to sleep on the couch in the basement.  Somehow he ended up upstairs and his wife found him passed out on the bathroom floor.  He woke up this morning without a headache, blurred vision or any other concerns.  His friend stated that he was not acting intoxicated.  No history of seizure/syncope.  Smokes marijuana and smoked some last night but no other drugs.   Loss of Consciousness      Home Medications Prior to Admission medications   Medication Sig Start Date End Date Taking? Authorizing Provider  albuterol (VENTOLIN HFA) 108 (90 Base) MCG/ACT inhaler Inhale 2 puffs into the lungs every 4 (four) hours as needed for wheezing or shortness of breath. 03/30/21   [provider]  aspirin EC 81 MG tablet Take 1 tablet (81 mg total) by mouth daily. Swallow whole. 10/22/21   Chandrasekhar, Rondel Jumbo, MD  atorvastatin (LIPITOR) 80 MG tablet Take 1 tablet (80 mg total) by mouth daily. 10/13/21   Chandrasekhar, Mahesh A, MD  benzonatate (TESSALON) 200 MG capsule Take 200 mg by mouth as needed. 10/21/21   [provider]  clopidogrel (PLAVIX) 75 MG tablet Take 1 tablet (75 mg total) by mouth daily. 10/22/21    Christell Constant, MD  losartan (COZAAR) 25 MG tablet Take 1 tablet (25 mg total) by mouth daily. 10/13/21   Riley Lam A, MD  metoprolol tartrate (LOPRESSOR) 25 MG tablet Take 0.5 tablets (12.5 mg total) by mouth 2 (two) times daily. 10/13/21 11/12/21  Christell Constant, MD  nitroGLYCERIN (NITROSTAT) 0.4 MG SL tablet Place 1 tablet (0.4 mg total) under the tongue every 5 (five) minutes x 3 doses as needed for chest pain. 05/26/21 11/02/21  Zigmund Daniel., MD  sildenafil (VIAGRA) 50 MG tablet Take 0.5 tablets (25 mg total) by mouth daily as needed for erectile dysfunction. 05/26/21   Zigmund Daniel., MD      Allergies    Shellfish allergy    Review of Systems   Review of Systems  Cardiovascular:  Positive for syncope.    Physical Exam Updated Vital Signs BP (!) 128/94 (BP Location: Left Arm)   Pulse (!) 105   Temp 97.8 F (36.6 C) (Oral)   Resp 18   Ht 6' (1.829 m)   Wt 104.3 kg   SpO2 98%   BMI 31.19 kg/m  Physical Exam Vitals and nursing note reviewed.  Constitutional:      Appearance: Normal appearance.  HENT:     Head: Normocephalic and atraumatic.     Mouth/Throat:     Mouth: Mucous  membranes are moist.     Pharynx: Oropharynx is clear.  Eyes:     General: No scleral icterus.    Extraocular Movements: Extraocular movements intact.     Conjunctiva/sclera: Conjunctivae normal.     Pupils: Pupils are equal, round, and reactive to light.  Cardiovascular:     Rate and Rhythm: Normal rate and regular rhythm.  Pulmonary:     Effort: Pulmonary effort is normal. No respiratory distress.     Breath sounds: No wheezing.  Skin:    General: Skin is warm and dry.     Findings: No rash.  Neurological:     General: No focal deficit present.     Mental Status: He is alert.     Comments: Cranial nerves II through XII grossly intact.  5 out of 5 strength in bilateral upper and lower extremities.  EOMs intact.  PERRLA.   Psychiatric:         Mood and Affect: Mood normal.        Behavior: Behavior normal.     ED Results / Procedures / Treatments   Labs (all labs ordered are listed, but only abnormal results are displayed) Labs Reviewed  BASIC METABOLIC PANEL - Abnormal; Notable for the following components:      Result Value   Calcium 8.8 (*)    All other components within normal limits  CBC - Abnormal; Notable for the following components:   Hemoglobin 17.2 (*)    All other components within normal limits  ETHANOL - Abnormal; Notable for the following components:   Alcohol, Ethyl (B) 17 (*)    All other components within normal limits  PROTIME-INR  APTT  URINALYSIS, ROUTINE W REFLEX MICROSCOPIC  RAPID URINE DRUG SCREEN, HOSP PERFORMED  CBG MONITORING, ED  TROPONIN I (HIGH SENSITIVITY)  TROPONIN I (HIGH SENSITIVITY)    EKG None  Radiology No results found.  Procedures Procedures   Medications Ordered in ED Medications  sodium chloride 0.9 % bolus 500 mL (has no administration in time range)    ED Course/ Medical Decision Making/ A&P Clinical Course as of 05/08/22 1804  Sun May 08, 2022  1802 Patient requesting discharge at this time.  We will follow-up on the remainder of results online.  Only thing pending is drug test. [MR]    Clinical Course User Index [MR] Domnique Vanegas, Gabriel Cirri, PA-C                           Medical Decision Making Amount and/or Complexity of Data Reviewed Labs: ordered. Radiology: ordered.   This patient presents to the ED for concern of loss of consciousness/"blackout."  Differential includes but is not limited to CVA, substance use, dementia.   This is not an exhaustive differential.    Past Medical History / Co-morbidities / Social History: Tobacco use, CAD, high cholesterol   Additional history: I reviewed patient's stent placement in August of last year.  He had stents placed for an NSTEMI.   Physical Exam: Normal neurological exam  Lab Tests: I ordered, and  personally interpreted labs.  The pertinent results include: Ethanol 17   Imaging Studies: I ordered and independently visualized and interpreted CT imaging which showed no abnormalities. I agree with the radiologist interpretation.     Medications: I ordered medication including fluids. Reevaluation of the patient after these medicines showed that the patient improved. I have reviewed the patients home medicines and have made adjustments as needed.  Disposition: 56 year old male presenting today with a concern for loss of consciousness.  He did not remember anything that he did last night.  Felt fine this morning.  Work-up remarkable for an ethanol of 17.  He reported only 3 alcoholic drinks between 7 and 10 last night.  This is not consistent with still having an elevated blood alcohol content.  Likely had more to drink at a loss of consciousness/memory loss secondary to alcohol.  UDS pending at this time.  It is not going to change what we do today so he would like to be discharged and follow-up online.  I believe this is reasonable.  Low suspicion CVA or other condition causing memory loss outside of substance use.  Below the legal driving limit, hemodynamically stable and will be discharged at this time.  Final Clinical Impression(s) / ED Diagnoses Final diagnoses:  Memory loss    Rx / DC Orders ED Discharge Orders     None      Results and diagnoses were explained to the patient. Return precautions discussed in full. Patient had no additional questions and expressed complete understanding.   This chart was dictated using voice recognition software.  Despite best efforts to proofread,  errors can occur which can change the documentation meaning.    Saddie Benders, PA-C 05/08/22 1807    Derwood Kaplan, MD 05/08/22 2144

## 2022-05-12 IMAGING — MR MR CARD MORPHOLOGY WO/W CM
45 of 48 series · 45 of 48 positions shown · IV contrast (Contrast agent)
Comparison: none

CLINICAL DATA: Clinical questions of myocarditis and microvascular
disease
Study assumes HCT of 47 and BSA of

EXAM:
CARDIAC MRI
TECHNIQUE: The patient was scanned on a 1.5 Tesla GE magnet. A dedicated
cardiac coil was used. Functional imaging was done using Fiesta
sequences. [DATE], and 4 chamber views were done to assess for RWMA's.
Modified Tigist rule using a short axis stack was used to
calculate an ejection fraction on a dedicated work station using
Circle software. The patient received 10 cc of Gadavist. After 10
minutes inversion recovery sequences were used to assess for
infiltration and scar tissue.
CONTRAST:  10 cc  of Gadavist

[Series 4: t2_haste_db_tra_bh · axial · 8.0mm · 1.41mm/px · 1 of 16 slices shown]
[im 1/16]
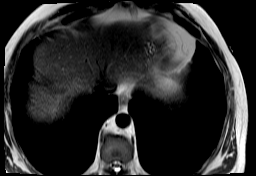

[Series 8: bSSFP · coronal · 8.0mm · 1.61mm/px · 1 of 25 slices shown (1 of 20)]
[im 1/25]
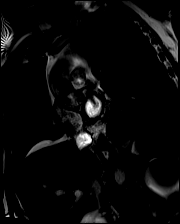

[Series 9: bSSFP · coronal · 8.0mm · 1.61mm/px · 1 of 25 slices shown (2 of 20)]
[im 1/25]
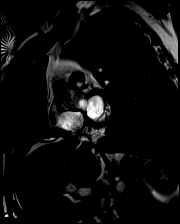

[Series 10: bSSFP · coronal · 8.0mm · 1.61mm/px · 1 of 25 slices shown (3 of 20)]
[im 1/25]
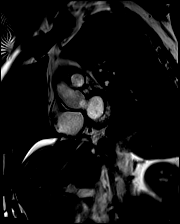

[Series 11: bSSFP · coronal · 8.0mm · 1.61mm/px · 1 of 25 slices shown (4 of 20)]
[im 1/25]
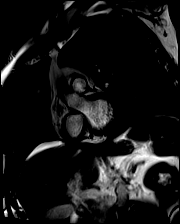

[Series 12: bSSFP · coronal · 8.0mm · 1.61mm/px · 1 of 25 slices shown (5 of 20)]
[im 1/25]
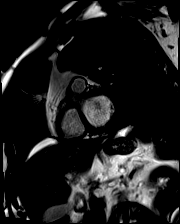

[Series 13: bSSFP · coronal · 8.0mm · 1.61mm/px · 1 of 25 slices shown (6 of 20)]
[im 1/25]
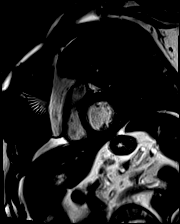

[Series 14: bSSFP · coronal · 8.0mm · 1.61mm/px · 1 of 25 slices shown (7 of 20)]
[im 1/25]
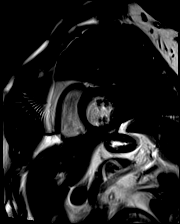

[Series 15: bSSFP · coronal · 8.0mm · 1.61mm/px · 1 of 25 slices shown (8 of 20)]
[im 1/25]
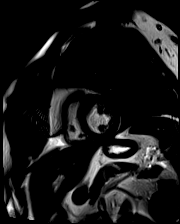

[Series 16: bSSFP · coronal · 8.0mm · 1.61mm/px · 1 of 25 slices shown (9 of 20)]
[im 1/25]
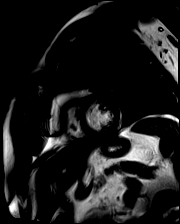

[Series 17: bSSFP · coronal · 8.0mm · 1.61mm/px · 1 of 25 slices shown (10 of 20)]
[im 1/25]
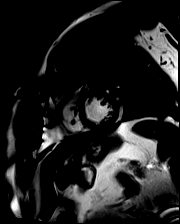

[Series 18: bSSFP · coronal · 8.0mm · 1.61mm/px · 1 of 25 slices shown (11 of 20)]
[im 1/25]
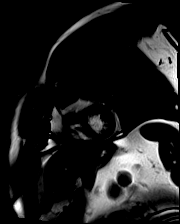

[Series 19: bSSFP · coronal · 8.0mm · 1.61mm/px · 1 of 25 slices shown (12 of 20)]
[im 1/25]
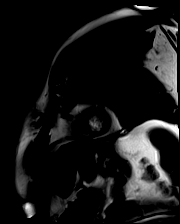

[Series 20: bSSFP · coronal · 8.0mm · 1.61mm/px · 1 of 25 slices shown (13 of 20)]
[im 1/25]
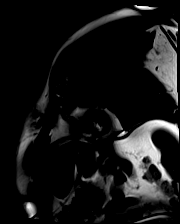

[Series 21: bSSFP · coronal · 8.0mm · 1.61mm/px · 1 of 25 slices shown (14 of 20)]
[im 1/25]
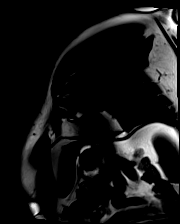

[Series 22: bSSFP · coronal · 8.0mm · 1.61mm/px · 1 of 25 slices shown (15 of 20)]
[im 1/25]
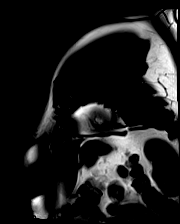

[Series 23: bSSFP · coronal · 8.0mm · 1.61mm/px · 1 of 25 slices shown (16 of 20)]
[im 1/25]
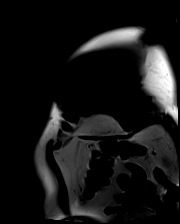

[Series 24: bSSFP · coronal · 8.0mm · 1.61mm/px · 1 of 25 slices shown (17 of 20)]
[im 1/25]
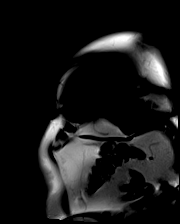

[Series 25: cine_trufi_cs_rt_short axis · coronal · 8.0mm · 1.92mm/px · 1 of 13 slices shown (1 of 18)]
[im 1/13]
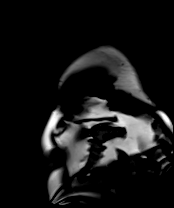

[Series 25: cine_trufi_cs_rt_short axis · coronal · 8.0mm · 1.92mm/px · 1 of 13 slices shown (2 of 18)]
[im 1/13]
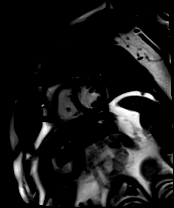

[Series 25: cine_trufi_cs_rt_short axis · coronal · 8.0mm · 1.92mm/px · 1 of 13 slices shown (3 of 18)]
[im 1/13]
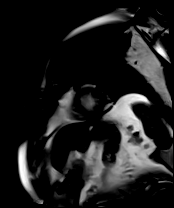

[Series 25: cine_trufi_cs_rt_short axis · coronal · 8.0mm · 1.92mm/px · 1 of 13 slices shown (4 of 18)]
[im 1/13]
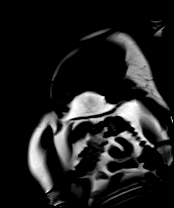

[Series 25: cine_trufi_cs_rt_short axis · coronal · 8.0mm · 1.92mm/px · 1 of 13 slices shown (5 of 18)]
[im 1/13]
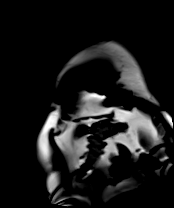

[Series 25: cine_trufi_cs_rt_short axis · coronal · 8.0mm · 1.92mm/px · 1 of 13 slices shown (6 of 18)]
[im 1/13]
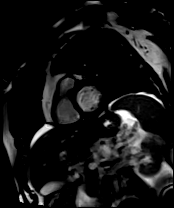

[Series 25: cine_trufi_cs_rt_short axis · coronal · 8.0mm · 1.92mm/px · 1 of 13 slices shown (7 of 18)]
[im 1/13]
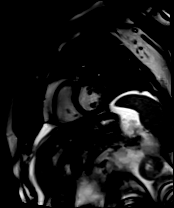

[Series 25: cine_trufi_cs_rt_short axis · coronal · 8.0mm · 1.92mm/px · 1 of 13 slices shown (8 of 18)]
[im 1/13]
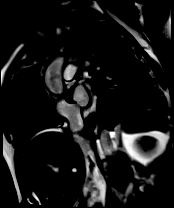

[Series 25: cine_trufi_cs_rt_short axis · coronal · 8.0mm · 1.92mm/px · 1 of 13 slices shown (9 of 18)]
[im 1/13]
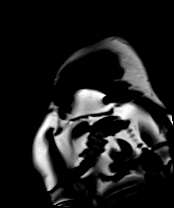

[Series 25: cine_trufi_cs_rt_short axis · coronal · 8.0mm · 1.92mm/px · 1 of 13 slices shown (10 of 18)]
[im 1/13]
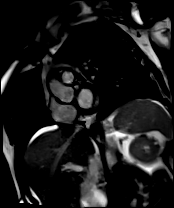

[Series 25: cine_trufi_cs_rt_short axis · coronal · 8.0mm · 1.92mm/px · 1 of 13 slices shown (11 of 18)]
[im 1/13]
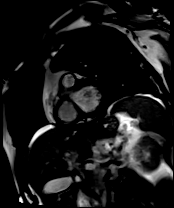

[Series 25: cine_trufi_cs_rt_short axis · coronal · 8.0mm · 1.92mm/px · 1 of 13 slices shown (12 of 18)]
[im 1/13]
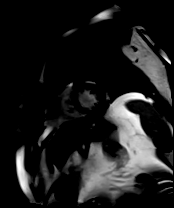

[Series 25: cine_trufi_cs_rt_short axis · coronal · 8.0mm · 1.92mm/px · 1 of 13 slices shown (13 of 18)]
[im 1/13]
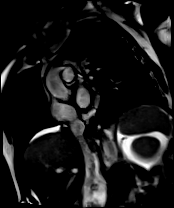

[Series 25: cine_trufi_cs_rt_short axis · coronal · 8.0mm · 1.92mm/px · 1 of 13 slices shown (14 of 18)]
[im 1/13]
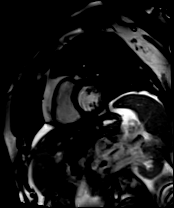

[Series 25: cine_trufi_cs_rt_short axis · coronal · 8.0mm · 1.92mm/px · 1 of 13 slices shown (15 of 18)]
[im 1/13]
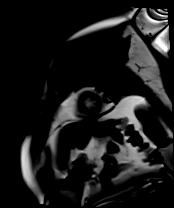

[Series 25: cine_trufi_cs_rt_short axis · coronal · 8.0mm · 1.92mm/px · 1 of 13 slices shown (16 of 18)]
[im 1/13]
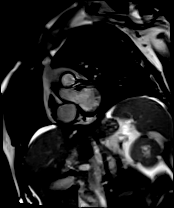

[Series 25: cine_trufi_cs_rt_short axis · coronal · 8.0mm · 1.92mm/px · 1 of 13 slices shown (17 of 18)]
[im 1/13]
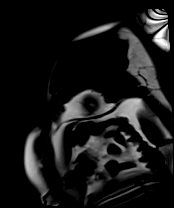

[Series 25: cine_trufi_cs_rt_short axis · coronal · 8.0mm · 1.92mm/px · 1 of 13 slices shown (18 of 18)]
[im 1/13]
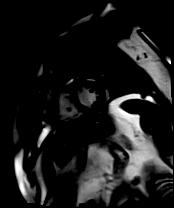

[Series 26: bSSFP · oblique · 6.0mm · 1.41mm/px · 1 of 25 slices shown (18 of 20)]
[im 1/25]
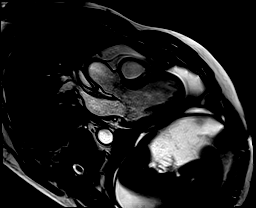

[Series 27: bSSFP · sagittal · 6.0mm · 1.41mm/px · 1 of 25 slices shown (19 of 20)]
[im 1/25]
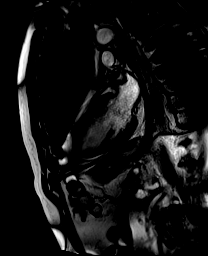

[Series 28: bSSFP · axial · 6.0mm · 1.41mm/px · 1 of 25 slices shown (20 of 20)]
[im 1/25]
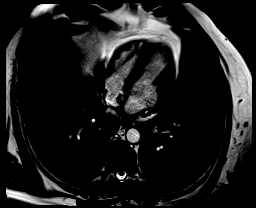

[Series 29: (id)_long_t1 · oblique · 8.0mm · 1.56mm/px · 1 of 24 slices shown]
[im 1/24]
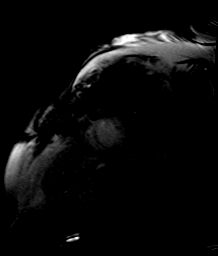

[Series 30: (id)_long_t1_moco · oblique · 8.0mm · 1.56mm/px · 1 of 24 slices shown]
[im 1/24]
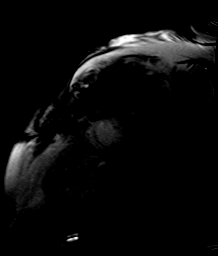

[Series 31: (id)_long_t1_moco_t1 · oblique · 8.0mm · 1.56mm/px · 1 of 6 slices shown]
[im 1/6]
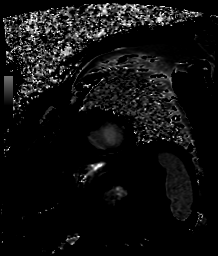

[Series 33: (id)_trufi · oblique · 8.0mm · 2.08mm/px · 1 of 9 slices shown]
[im 1/9]
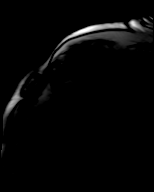

[Series 34: (id)_trufi_moco · oblique · 8.0mm · 2.08mm/px · 1 of 9 slices shown]
[im 1/9]
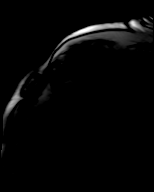

[Series 35: (id)_trufi_moco_t2 · oblique · 8.0mm · 2.08mm/px · 1 of 3 slices shown]
[im 1/3]
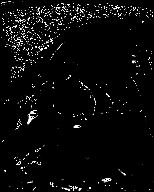

[45 of 48 positions shown; findings below may reference images not displayed]

FINDINGS: 1. Normal left ventricular size, with LVEDD 47 mm, and LVEDVi 61
mL/m2.

Normal left ventricular thickness, with intraventricular septal
thickness of 10 mm, posterior wall thickness of 7 mm. Myocardial
mass index normal at 42 g/m2.

Mildly decreased left ventricular systolic function (LVEF =52%).
There are no regional wall motion abnormalities.

Left ventricular parametric mapping notable for normal ECV signal
and normal T2 signal.

There is no significant evidence of late gadolinium enhancement in
the left ventricular myocardium: In the mid anteroseptal
distribution, this is a small epicardial area 5 standard deviations
above normal without concurrent increase in T1 signal. This is less
consistent with true late gadolinium enhancement.

2. Small right ventricular size with RVEDVI 57 mL/m2.

Normal right ventricular thickness.

Normal right ventricular systolic function (RVEF =56%). There are no
regional wall motion abnormalities or aneurysms.

3.  Normal left and right atrial size.

4. Normal size of the aortic root, ascending aorta and pulmonary
artery.

5.  No significant valvular abnormalities.

Tri-leaflet aortic valve.

6. Normal pericardium. No pericardial effusion. Prominent
pericardial fat pad.

7. Grossly, no extracardiac findings. Recommended dedicated study if
concerned for non-cardiac pathology.

8. Breathhold artifact most notable during LGE assessment, which
limits accuracy of assessment.
IMPRESSION: Mild decrease in LV function (low normal).

Modified [place] Criteria for myocarditis not met.

## 2022-07-25 ENCOUNTER — Other Ambulatory Visit: Payer: Self-pay | Admitting: Internal Medicine

## 2022-07-29 ENCOUNTER — Ambulatory Visit: Payer: No Typology Code available for payment source | Attending: Internal Medicine | Admitting: Internal Medicine

## 2022-07-29 ENCOUNTER — Encounter: Payer: Self-pay | Admitting: Internal Medicine

## 2022-07-29 VITALS — BP 108/68 | HR 89 | Ht 72.0 in | Wt 224.0 lb

## 2022-07-29 DIAGNOSIS — Z720 Tobacco use: Secondary | ICD-10-CM | POA: Diagnosis not present

## 2022-07-29 DIAGNOSIS — I2510 Atherosclerotic heart disease of native coronary artery without angina pectoris: Secondary | ICD-10-CM | POA: Diagnosis not present

## 2022-07-29 DIAGNOSIS — E782 Mixed hyperlipidemia: Secondary | ICD-10-CM | POA: Diagnosis not present

## 2022-07-29 NOTE — Patient Instructions (Signed)
Medication Instructions:  Your physician has recommended you make the following change in your medication:  STOP: metoprolol tartrate (lopressor)  STOP: clopidogrel (Plavix)  *If you need a refill on your cardiac medications before your next appointment, please call your pharmacy*   Lab Work: NONE If you have labs (blood work) drawn today and your tests are completely normal, you will receive your results only by: [place] (if you have MyChart) OR A paper copy in the mail If you have any lab test that is abnormal or we need to change your treatment, we will call you to review the results.   Testing/Procedures: NONE   Follow-Up: At [facility], you and your health needs are our priority.  As part of our continuing mission to provide you with exceptional heart care, we have created designated Provider Care Teams.  These Care Teams include your primary Cardiologist (physician) and Advanced Practice Providers (APPs -  Physician Assistants and Nurse Practitioners) who all work together to provide you with the care you need, when you need it.   Your next appointment:   1 year(s)  The format for your next appointment:   In Person  Provider:   Werner Lean, MD     Important Information About Sugar

## 2022-07-29 NOTE — Progress Notes (Signed)
Cardiology Office Note:    Date:  07/29/2022   ID:  Edward Serrano, DOB 10-14-1965, MRN [id number]  PCP:  Pcp, No   [facility] Providers Cardiologist:  Werner Lean, MD     Referring MD: No ref. provider found   CC: F/u CAD  History of Present Illness:    Edward Serrano is a 56 y.o. male with a hx of prior tobacco abuse, CAD (small caliber diagonal 80%), who presents for follow up after 8/22 NSTEMI admission (with negative CMR).    Planned for DAPT stop. Did not get a letter for f/u.  Patient notes that he is doing great.   . Angina was shoulder burning in 2022.  None since. No nitroglycerin needed.  No chest pain or pressure .  No SOB/DOE and no PND/Orthopnea.  No weight gain or leg swelling.  No palpitations or syncope.  Had one episode with the metoprolol and alcohol.  Had some drinks and a couple shots and felt. And thinks got fuzzy.  Blacked out. Occurred with 150 proof shots and two beers. Hasn't drank since. Reduced but still has not stopped smoking.   Past Medical History:  Diagnosis Date   CAD in native artery    a. NSTEMI 05/2021 - Cath showed only obstructive disease in a small caliber diagonal (80%), otherwise 25% D2, EF 50-55%, normal LVEDP, normal LVSF. 2D echo showed normal EF, otherwise no significant findings.   OCD (obsessive compulsive disorder)    Tobacco abuse     Past Surgical History:  Procedure Laterality Date   ANKLE RECONSTRUCTION     APPENDECTOMY     LEFT HEART CATH AND CORONARY ANGIOGRAPHY N/A 05/25/2021   Procedure: LEFT HEART CATH AND CORONARY ANGIOGRAPHY;  Surgeon: Jettie Booze, MD;  Location: [facility];  Service: Cardiovascular;  Laterality: N/A;    Current Medications: Current Meds  Medication Sig   aspirin EC 81 MG tablet Take 1 tablet (81 mg total) by mouth daily. Swallow whole.   atorvastatin (LIPITOR) 80 MG tablet Take 1 tablet (80 mg total) by mouth daily.   losartan (COZAAR) 25 MG tablet Take 1  tablet (25 mg total) by mouth daily.   nitroGLYCERIN (NITROSTAT) 0.4 MG SL tablet Place 1 tablet (0.4 mg total) under the tongue every 5 (five) minutes x 3 doses as needed for chest pain.   sildenafil (VIAGRA) 50 MG tablet Take 0.5 tablets (25 mg total) by mouth daily as needed for erectile dysfunction.   [DISCONTINUED] clopidogrel (PLAVIX) 75 MG tablet TAKE 1 TABLET BY MOUTH EVERY DAY   [DISCONTINUED] metoprolol tartrate (LOPRESSOR) 25 MG tablet Take 0.5 tablets (12.5 mg total) by mouth 2 (two) times daily.     Allergies:   Shellfish allergy   Social History   Socioeconomic History   Marital status: Married    Spouse name: Not on file   Number of children: Not on file   Years of education: Not on file   Highest education level: Not on file  Occupational History   Not on file  Tobacco Use   Smoking status: Every Day    Packs/day: 1.00    Types: Cigarettes   Smokeless tobacco: Never  Substance and Sexual Activity   Alcohol use: Yes    Comment: occasional   Drug use: Yes    Types: Marijuana   Sexual activity: Not on file  Other Topics Concern   Not on file  Social History Narrative   Not on file  Social Determinants of Health   Financial Resource Strain: Not on file  Food Insecurity: Not on file  Transportation Needs: Not on file  Physical Activity: Not on file  Stress: Not on file  Social Connections: Not on file    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 Doesn't know his dad's side of the family.  ROS:   Please see the history of present illness.     All other systems reviewed and are negative.  EKGs/Labs/Other Studies Reviewed:    The following studies were reviewed today:  EKG:   07/29/22:  06/10/21: SR 78 WNL  CMR: Date: 05/26/21 Results: IMPRESSION: Mild decrease in LV function (low normal).   [place]  Criteria for myocarditis not met.  Transthoracic Echocardiogram: Date: 05/25/21 Results:  1. Left ventricular ejection fraction, by estimation, is 50 to 55%. The  left ventricle has low normal function. The left ventricle has no regional  wall motion abnormalities. Left ventricular diastolic parameters were  normal.   2. Right ventricular systolic function is normal. The right ventricular  size is normal. Tricuspid regurgitation signal is inadequate for assessing  PA pressure.   3. The mitral valve is grossly normal. No evidence of mitral valve  regurgitation. No evidence of mitral stenosis.   4. The aortic valve was not well visualized but appears grossly normal.  Aortic valve regurgitation is not visualized. No aortic stenosis is  present.   5. The inferior vena cava is normal in size with greater than 50%  respiratory variability, suggesting right atrial pressure of 3 mmHg.    Left/Right Heart Catheterizations: Date: 05/25/21 Results:   1st Diag lesion is 80% stenosed.   2nd Diag lesion is 25% stenosed.   The left ventricular systolic function is normal.   LV end diastolic pressure is normal.   The left ventricular ejection fraction is 50-55% by visual estimate.   There is no aortic valve stenosis.  Only obstructive disease is in a small caliber diagonal vessel    Recent Labs: 11/02/2021: ALT 22 05/08/2022: BUN 11; Creatinine, Ser 1.10; Hemoglobin 17.2; Platelets 211; Potassium 4.0; Sodium 139  Recent Lipid Panel    Component Value Date/Time   CHOL 111 11/02/2021 1428   TRIG 285 (H) 11/02/2021 1428   HDL 26 (L) 11/02/2021 1428   CHOLHDL 4.3 11/02/2021 1428   CHOLHDL 5.4 05/25/2021 0511   VLDL 21 05/25/2021 0511   LDLCALC 41 11/02/2021 1428        Physical Exam:    VS:  BP 108/68   Pulse 89   Ht 6' (1.829 m)   Wt 224 lb (101.6 kg)   SpO2 94%   BMI 30.38 kg/m     Wt Readings from Last 3 Encounters:  07/29/22 224 lb (101.6 kg)  05/08/22 230 lb (104.3 kg)   11/02/21 229 lb (103.9 kg)    Gen: No distress Neck: No JVD Ears: Left Pilar Plate Sign Cardiac: No Rubs or Gallops, no murmur, regular rate +2 radial pulses Respiratory:  CTAB normal effort, normal  respiratory rate GI: Soft, nontender, non-distended  MS: No  edema;  moves all extremities Integument: Skin feels warm Neuro:  At time of evaluation, alert and oriented to person/place/time/situation  Psych: Normal affect, patient feels well  ASSESSMENT:    1. CAD in native artery   2. Mixed hyperlipidemia   3. Tobacco abuse  PLAN:    Coronary Artery Disease; Obstructive Current  Smoker  HLD - asymptomatic on medical therapy - anatomy: Small D1 disease - continue ASA 81 mg; cut plavix- discussed SE - continue statin, goal LDL < 70- discussed SE - will stop metoprolol; discussed alcohol and drinking  - continue nitrates; we have discussed red flag interaction with his PRN PDEi - continue ARB- discussed cardioprotect and given prior AKI would be appropriate - pre-contemplative of smoking  One year    Medication Adjustments/Labs and Tests Ordered: Current medicines are reviewed at length with the patient today.  Concerns regarding medicines are outlined above.  No orders of the defined types were placed in this encounter.  No orders of the defined types were placed in this encounter.   Patient Instructions  Medication Instructions:  Your physician has recommended you make the following change in your medication:  STOP: metoprolol tartrate (lopressor)  STOP: clopidogrel (Plavix)  *If you need a refill on your cardiac medications before your next appointment, please call your pharmacy*   Lab Work: NONE If you have labs (blood work) drawn today and your tests are completely normal, you will receive your results only by: [place] (if you have MyChart) OR A paper copy in the mail If you have any lab test that is abnormal or we need to change your treatment, we will  call you to review the results.   Testing/Procedures: NONE   Follow-Up: At [facility], you and your health needs are our priority.  As part of our continuing mission to provide you with exceptional heart care, we have created designated Provider Care Teams.  These Care Teams include your primary Cardiologist (physician) and Advanced Practice Providers (APPs -  Physician Assistants and Nurse Practitioners) who all work together to provide you with the care you need, when you need it.   Your next appointment:   1 year(s)  The format for your next appointment:   In Person  Provider:   Werner Lean, MD     Important Information About Sugar         Signed, Werner Lean, MD  07/29/2022 10:53 AM    [place]

## 2022-10-20 ENCOUNTER — Other Ambulatory Visit: Payer: Self-pay | Admitting: Internal Medicine

## 2022-10-23 ENCOUNTER — Other Ambulatory Visit: Payer: Self-pay | Admitting: Internal Medicine

## 2023-07-22 ENCOUNTER — Other Ambulatory Visit: Payer: Self-pay | Admitting: Internal Medicine

## 2023-08-18 ENCOUNTER — Other Ambulatory Visit: Payer: Self-pay | Admitting: Internal Medicine

## 2024-08-21 ENCOUNTER — Other Ambulatory Visit: Payer: Self-pay | Admitting: Internal Medicine

## 2024-09-15 ENCOUNTER — Other Ambulatory Visit: Payer: Self-pay | Admitting: Internal Medicine
